# Patient Record
Sex: Female | Born: 1955 | Race: White | Hispanic: No | Smoking: Former smoker
Health system: Southern US, Community
[De-identification: ages and names within clinical notes are randomized; demographics above are authoritative.]

## PROBLEM LIST (undated history)

## (undated) DIAGNOSIS — R519 Headache, unspecified: Secondary | ICD-10-CM

## (undated) DIAGNOSIS — E079 Disorder of thyroid, unspecified: Secondary | ICD-10-CM

## (undated) DIAGNOSIS — R079 Chest pain, unspecified: Secondary | ICD-10-CM

## (undated) DIAGNOSIS — R51 Headache: Secondary | ICD-10-CM

## (undated) DIAGNOSIS — K219 Gastro-esophageal reflux disease without esophagitis: Secondary | ICD-10-CM

## (undated) DIAGNOSIS — F419 Anxiety disorder, unspecified: Secondary | ICD-10-CM

## (undated) HISTORY — PX: ABDOMINAL HYSTERECTOMY: SHX81

## (undated) HISTORY — PX: TONSILLECTOMY: SUR1361

---

## 2015-12-17 ENCOUNTER — Other Ambulatory Visit: Payer: Self-pay | Admitting: Ophthalmology

## 2015-12-17 NOTE — Progress Notes (Signed)
Left voice message at [phone] to make MD aware that both phone numbers were invalid therefore, pt was not contacted with pre-op instructions. Will leave note for nursing staff in the AM.

## 2015-12-17 NOTE — Progress Notes (Signed)
Unable to contact pt because both contact numbers are invalid; [phone] rings and then stops and [phone] is not in service.

## 2015-12-18 ENCOUNTER — Ambulatory Visit (HOSPITAL_COMMUNITY): Payer: Self-pay | Admitting: Anesthesiology

## 2015-12-18 ENCOUNTER — Encounter (HOSPITAL_COMMUNITY): Payer: Self-pay | Admitting: Urology

## 2015-12-18 ENCOUNTER — Encounter (HOSPITAL_COMMUNITY)
Admission: RE | Disposition: A | Discharge status: Home / Self Care | Payer: Self-pay | Source: Ambulatory Visit | Attending: Ophthalmology

## 2015-12-18 ENCOUNTER — Ambulatory Visit (HOSPITAL_COMMUNITY)
Admission: RE | Admit: 2015-12-18 | Discharge: 2015-12-18 | Disposition: A | Discharge status: Home / Self Care | Payer: Self-pay | Source: Ambulatory Visit | Attending: Ophthalmology | Admitting: Ophthalmology

## 2015-12-18 DIAGNOSIS — K219 Gastro-esophageal reflux disease without esophagitis: Secondary | ICD-10-CM | POA: Insufficient documentation

## 2015-12-18 DIAGNOSIS — H33011 Retinal detachment with single break, right eye: Secondary | ICD-10-CM

## 2015-12-18 DIAGNOSIS — Z87891 Personal history of nicotine dependence: Secondary | ICD-10-CM | POA: Insufficient documentation

## 2015-12-18 DIAGNOSIS — H3321 Serous retinal detachment, right eye: Secondary | ICD-10-CM | POA: Insufficient documentation

## 2015-12-18 HISTORY — DX: Disorder of thyroid, unspecified: E07.9

## 2015-12-18 HISTORY — PX: SCLERAL BUCKLE: SHX5340

## 2015-12-18 HISTORY — DX: Chest pain, unspecified: R07.9

## 2015-12-18 HISTORY — DX: Anxiety disorder, unspecified: F41.9

## 2015-12-18 HISTORY — DX: Headache: R51

## 2015-12-18 HISTORY — DX: Headache, unspecified: R51.9

## 2015-12-18 HISTORY — DX: Gastro-esophageal reflux disease without esophagitis: K21.9

## 2015-12-18 LAB — BASIC METABOLIC PANEL
Anion gap: 8 (ref 5–15)
BUN: 8 mg/dL (ref 6–20)
CHLORIDE: 108 mmol/L (ref 101–111)
CO2: 26 mmol/L (ref 22–32)
CREATININE: 0.7 mg/dL (ref 0.44–1.00)
Calcium: 9.3 mg/dL (ref 8.9–10.3)
GFR calc Af Amer: >60 mL/min (ref >60)
GFR calc non Af Amer: >60 mL/min (ref >60)
GLUCOSE: 89 mg/dL (ref 65–99)
POTASSIUM: 3.6 mmol/L (ref 3.5–5.1)
Sodium: 142 mmol/L (ref 135–145)

## 2015-12-18 LAB — CBC
HEMATOCRIT: 38.8 % (ref 36.0–46.0)
HEMOGLOBIN: 12.5 g/dL (ref 12.0–15.0)
MCH: 29.7 pg (ref 26.0–34.0)
MCHC: 32.2 g/dL (ref 30.0–36.0)
MCV: 92.2 fL (ref 78.0–100.0)
Platelets: 231 10*3/uL (ref 150–400)
RBC: 4.21 MIL/uL (ref 3.87–5.11)
RDW: 12.9 % (ref 11.5–15.5)
WBC: 4.7 10*3/uL (ref 4.0–10.5)

## 2015-12-18 SURGERY — SCLERAL BUCKLE
Anesthesia: General | Site: Eye | Laterality: Right

## 2015-12-18 MED ORDER — STERILE WATER FOR INJECTION IJ SOLN
INTRAMUSCULAR | Status: AC
  Filled 2015-12-18: qty 10

## 2015-12-18 MED ORDER — FENTANYL CITRATE (PF) 100 MCG/2ML IJ SOLN
INTRAMUSCULAR | Status: AC
  Filled 2015-12-18: qty 2

## 2015-12-18 MED ORDER — HYPROMELLOSE (GONIOSCOPIC) 2.5 % OP SOLN
OPHTHALMIC | Status: DC | PRN
  Administered 2015-12-18: 2 [drp] via OPHTHALMIC

## 2015-12-18 MED ORDER — BSS IO SOLN
INTRAOCULAR | Status: DC | PRN
  Administered 2015-12-18: 15 mL via INTRAOCULAR

## 2015-12-18 MED ORDER — TOBRAMYCIN-DEXAMETHASONE 0.3-0.1 % OP OINT
TOPICAL_OINTMENT | OPHTHALMIC | Status: AC
  Filled 2015-12-18: qty 3.5

## 2015-12-18 MED ORDER — SUGAMMADEX SODIUM 200 MG/2ML IV SOLN
INTRAVENOUS | Status: DC | PRN
  Administered 2015-12-18: 200 mg via INTRAVENOUS

## 2015-12-18 MED ORDER — CEFAZOLIN (ANCEF) 1 G IV SOLR
1.0000 g | INTRAVENOUS | Status: DC
  Filled 2015-12-18: qty 1

## 2015-12-18 MED ORDER — DEXAMETHASONE SODIUM PHOSPHATE 10 MG/ML IJ SOLN
INTRAMUSCULAR | Status: DC | PRN
  Administered 2015-12-18: 10 mg

## 2015-12-18 MED ORDER — HYALURONIDASE HUMAN 150 UNIT/ML IJ SOLN
INTRAMUSCULAR | Status: AC
  Filled 2015-12-18: qty 1

## 2015-12-18 MED ORDER — DEXAMETHASONE SODIUM PHOSPHATE 4 MG/ML IJ SOLN
INTRAMUSCULAR | Status: DC | PRN
  Administered 2015-12-18: 10 mg via INTRAVENOUS

## 2015-12-18 MED ORDER — LACTATED RINGERS IV SOLN
INTRAVENOUS | Status: DC
  Administered 2015-12-18: 12:00:00 via INTRAVENOUS

## 2015-12-18 MED ORDER — MIDAZOLAM HCL 2 MG/2ML IJ SOLN
INTRAMUSCULAR | Status: AC
  Filled 2015-12-18: qty 2

## 2015-12-18 MED ORDER — ATROPINE SULFATE 1 % OP SOLN
1.0000 [drp] | OPHTHALMIC | Status: AC
  Administered 2015-12-18 (×3): 1 [drp] via OPHTHALMIC

## 2015-12-18 MED ORDER — LACTATED RINGERS IV SOLN
INTRAVENOUS | Status: DC | PRN
  Administered 2015-12-18 (×2): via INTRAVENOUS

## 2015-12-18 MED ORDER — MIDAZOLAM HCL 5 MG/5ML IJ SOLN
INTRAMUSCULAR | Status: DC | PRN
  Administered 2015-12-18: 1 mg via INTRAVENOUS

## 2015-12-18 MED ORDER — ACETAMINOPHEN 325 MG PO TABS
ORAL_TABLET | ORAL | Status: AC
  Filled 2015-12-18: qty 2

## 2015-12-18 MED ORDER — STERILE WATER FOR INJECTION IJ SOLN
INTRAMUSCULAR | Status: DC | PRN
  Administered 2015-12-18: 10 mL

## 2015-12-18 MED ORDER — BUPIVACAINE HCL (PF) 0.75 % IJ SOLN
INTRAMUSCULAR | Status: DC | PRN
  Administered 2015-12-18: 2 mL

## 2015-12-18 MED ORDER — LIDOCAINE HCL (CARDIAC) 20 MG/ML IV SOLN
INTRAVENOUS | Status: DC | PRN
  Administered 2015-12-18: 60 mg via INTRAVENOUS

## 2015-12-18 MED ORDER — HYPROMELLOSE (GONIOSCOPIC) 2.5 % OP SOLN
OPHTHALMIC | Status: AC
  Filled 2015-12-18: qty 15

## 2015-12-18 MED ORDER — PROPOFOL 10 MG/ML IV BOLUS
INTRAVENOUS | Status: DC | PRN
  Administered 2015-12-18: 180 mg via INTRAVENOUS

## 2015-12-18 MED ORDER — PHENYLEPHRINE HCL 10 MG/ML IJ SOLN
INTRAMUSCULAR | Status: DC | PRN
  Administered 2015-12-18 (×2): 40 ug via INTRAVENOUS

## 2015-12-18 MED ORDER — FENTANYL CITRATE (PF) 100 MCG/2ML IJ SOLN: 25.0000 ug | INTRAMUSCULAR | Status: DC | PRN

## 2015-12-18 MED ORDER — ACETAMINOPHEN 325 MG PO TABS
650.0000 mg | ORAL_TABLET | Freq: Once | ORAL | Status: AC
  Administered 2015-12-18: 650 mg via ORAL

## 2015-12-18 MED ORDER — PROPOFOL 10 MG/ML IV BOLUS
INTRAVENOUS | Status: AC
  Filled 2015-12-18: qty 20

## 2015-12-18 MED ORDER — LIDOCAINE HCL 2 % IJ SOLN
INTRAMUSCULAR | Status: AC
  Filled 2015-12-18: qty 20

## 2015-12-18 MED ORDER — LIDOCAINE HCL 2 % IJ SOLN
INTRAMUSCULAR | Status: DC | PRN
  Administered 2015-12-18: 2 mL

## 2015-12-18 MED ORDER — BSS IO SOLN
INTRAOCULAR | Status: AC
  Filled 2015-12-18: qty 15

## 2015-12-18 MED ORDER — BUPIVACAINE HCL (PF) 0.75 % IJ SOLN
INTRAMUSCULAR | Status: AC
  Filled 2015-12-18: qty 10

## 2015-12-18 MED ORDER — PHENYLEPHRINE HCL 2.5 % OP SOLN
1.0000 [drp] | OPHTHALMIC | Status: AC
  Administered 2015-12-18 (×3): 1 [drp] via OPHTHALMIC

## 2015-12-18 MED ORDER — EPINEPHRINE HCL 1 MG/ML IJ SOLN
INTRAMUSCULAR | Status: AC
  Filled 2015-12-18: qty 1

## 2015-12-18 MED ORDER — ROCURONIUM BROMIDE 100 MG/10ML IV SOLN
INTRAVENOUS | Status: DC | PRN
  Administered 2015-12-18: 40 mg via INTRAVENOUS
  Administered 2015-12-18: 20 mg via INTRAVENOUS

## 2015-12-18 MED ORDER — ONDANSETRON HCL 4 MG/2ML IJ SOLN
INTRAMUSCULAR | Status: DC | PRN
  Administered 2015-12-18: 4 mg via INTRAVENOUS

## 2015-12-18 MED ORDER — CEFAZOLIN SODIUM 1 G IJ SOLR
INTRAMUSCULAR | Status: DC | PRN
  Administered 2015-12-18: 1 g

## 2015-12-18 MED ORDER — ATROPINE SULFATE 1 % OP SOLN
1.0000 [drp] | OPHTHALMIC | Status: DC
  Filled 2015-12-18: qty 5

## 2015-12-18 MED ORDER — DEXAMETHASONE SODIUM PHOSPHATE 10 MG/ML IJ SOLN
INTRAMUSCULAR | Status: AC
  Filled 2015-12-18: qty 1

## 2015-12-18 MED ORDER — PHENYLEPHRINE HCL 2.5 % OP SOLN
1.0000 [drp] | OPHTHALMIC | Status: DC
  Filled 2015-12-18: qty 2

## 2015-12-18 MED ORDER — TETRACAINE HCL 0.5 % OP SOLN
OPHTHALMIC | Status: AC
  Filled 2015-12-18: qty 2

## 2015-12-18 MED ORDER — HYALURONIDASE HUMAN 150 UNIT/ML IJ SOLN
INTRAMUSCULAR | Status: DC | PRN
  Administered 2015-12-18: 75 [IU]

## 2015-12-18 MED ORDER — LIDOCAINE HCL (PF) 1 % IJ SOLN
INTRAMUSCULAR | Status: AC
  Filled 2015-12-18: qty 30

## 2015-12-18 MED ORDER — BSS PLUS IO SOLN
INTRAOCULAR | Status: AC
  Filled 2015-12-18: qty 500

## 2015-12-18 MED ORDER — FENTANYL CITRATE (PF) 100 MCG/2ML IJ SOLN
INTRAMUSCULAR | Status: DC | PRN
  Administered 2015-12-18 (×2): 50 ug via INTRAVENOUS

## 2015-12-18 MED ORDER — TOBRAMYCIN-DEXAMETHASONE 0.3-0.1 % OP OINT
TOPICAL_OINTMENT | OPHTHALMIC | Status: DC | PRN
  Administered 2015-12-18: 1 via OPHTHALMIC

## 2015-12-18 SURGICAL SUPPLY — 42 items
APL SRG 3 HI ABS STRL LF PLS (MISCELLANEOUS) ×1
APPLICATOR COTTON TIP 6IN STRL (MISCELLANEOUS) ×3 IMPLANT
APPLICATOR DR MATTHEWS STRL (MISCELLANEOUS) ×3 IMPLANT
BAND SCLERAL BUCKLING TYPE 42 (Ophthalmic Related) ×2 IMPLANT
CANNULA DUALBORE 25G (Cannula) ×3 IMPLANT
CORDS BIPOLAR (ELECTRODE) ×2 IMPLANT
COVER SURGICAL LIGHT HANDLE (MISCELLANEOUS) ×3 IMPLANT
FILTER BLUE MILLIPORE (MISCELLANEOUS) ×2 IMPLANT
GAS OPHTHALMIC (MISCELLANEOUS) IMPLANT
GLOVE BIO SURGEON STRL SZ7.5 (GLOVE) ×5 IMPLANT
GLOVE BIOGEL PI IND STRL 7.0 (GLOVE) IMPLANT
GLOVE BIOGEL PI INDICATOR 7.0 (GLOVE) ×2
GOWN STRL REUS W/ TWL LRG LVL3 (GOWN DISPOSABLE) ×2 IMPLANT
GOWN STRL REUS W/TWL LRG LVL3 (GOWN DISPOSABLE) ×6
KIT BASIN OR (CUSTOM PROCEDURE TRAY) ×3 IMPLANT
KIT ROOM TURNOVER OR (KITS) ×3 IMPLANT
LENS BIOM SUPER VIEW SET DISP (OPHTHALMIC RELATED) ×2 IMPLANT
NDL 18GX1X1/2 (RX/OR ONLY) (NEEDLE) ×1 IMPLANT
NDL FILTER BLUNT 18X1 1/2 (NEEDLE) IMPLANT
NDL HYPO 25GX1X1/2 BEV (NEEDLE) IMPLANT
NDL HYPO 30X.5 LL (NEEDLE) ×2 IMPLANT
NEEDLE 18GX1X1/2 (RX/OR ONLY) (NEEDLE) ×6 IMPLANT
NEEDLE FILTER BLUNT 18X 1/2SAF (NEEDLE) ×2
NEEDLE FILTER BLUNT 18X1 1/2 (NEEDLE) ×1 IMPLANT
NEEDLE HYPO 25GX1X1/2 BEV (NEEDLE) ×3 IMPLANT
NEEDLE HYPO 30X.5 LL (NEEDLE) ×6 IMPLANT
NS IRRIG 1000ML POUR BTL (IV SOLUTION) ×3 IMPLANT
PACK VITRECTOMY CUSTOM (CUSTOM PROCEDURE TRAY) ×3 IMPLANT
PAD ARMBOARD 7.5X6 YLW CONV (MISCELLANEOUS) ×6 IMPLANT
ROLLS DENTAL (MISCELLANEOUS) ×4 IMPLANT
SHEET MEDIUM DRAPE 40X70 STRL (DRAPES) ×3 IMPLANT
SLEEVE SURGEON STRL (DRAPES) ×2 IMPLANT
STOCKINETTE IMPERVIOUS 9X36 MD (GAUZE/BANDAGES/DRESSINGS) ×4 IMPLANT
SUT ETHILON 5.0 S-24 (SUTURE) ×5 IMPLANT
SUT SILK 2 0 (SUTURE) ×3
SUT SILK 2-0 18XBRD TIE 12 (SUTURE) ×1 IMPLANT
SUT VICRYL 7 0 TG140 8 (SUTURE) ×2 IMPLANT
SYR 3ML LL SCALE MARK (SYRINGE) ×2 IMPLANT
SYRINGE 10CC LL (SYRINGE) ×2 IMPLANT
TOWEL OR 17X24 6PK STRL BLUE (TOWEL DISPOSABLE) ×4 IMPLANT
WATER STERILE IRR 1000ML POUR (IV SOLUTION) ×3 IMPLANT
WIPE INSTRUMENT VISIWIPE 73X73 (MISCELLANEOUS) ×2 IMPLANT

## 2015-12-18 NOTE — Anesthesia Postprocedure Evaluation (Signed)
Anesthesia Post Note  Patient: [name]  Procedure(s) Performed: Procedure(s) (LRB): SCLERAL BUCKLE with CRYO and GAS BUBBLE to RIGHT EYE (Right)  Patient location during evaluation: PACU Anesthesia Type: General Level of consciousness: awake Pain management: pain level controlled Vital Signs Assessment: post-procedure vital signs reviewed and stable Respiratory status: spontaneous breathing Cardiovascular status: stable Anesthetic complications: no    Last Vitals:  Vitals:   12/18/15 1525 12/18/15 1535  BP: 113/69 119/69  Pulse: 60   Resp: 15 12  Temp: 36.6 C 36.3 C    Last Pain:  Vitals:   12/18/15 1535  TempSrc:   PainSc: 0-No pain                 EDWARDS,Turner Kunzman

## 2015-12-18 NOTE — Brief Op Note (Signed)
12/18/2015  2:39 PM  PATIENT:  [name]  60 y.o. female  PRE-OPERATIVE DIAGNOSIS:  Retinal detachment  POST-OPERATIVE DIAGNOSIS:  Retinal detachment  PROCEDURE:  Procedure(s): SCLERAL BUCKLE with CRYO and GAS BUBBLE to RIGHT EYE (Right)  SURGEON:  Surgeon(s) and Role:    * Stephannie LiJason Quina Wilbourne, MD - Primary  PHYSICIAN ASSISTANT:   ASSISTANTS: none   ANESTHESIA:   general  EBL:  Total I/O In: 1000 [I.V.:1000] Out: -   BLOOD ADMINISTERED:none  DRAINS: none   LOCAL MEDICATIONS USED:  BUPIVICAINE   SPECIMEN:  No Specimen  DISPOSITION OF SPECIMEN:  N/A  COUNTS:  YES  TOURNIQUET:  * No tourniquets in log *  DICTATION: .Other Dictation: Dictation Number [id number]  PLAN OF CARE: Discharge to home after PACU  PATIENT DISPOSITION:  PACU - hemodynamically stable.   Delay start of Pharmacological VTE agent (>24hrs) due to surgical blood loss or risk of bleeding: not applicable

## 2015-12-18 NOTE — H&P (Signed)
[name] is an 60 y.o. female.   Chief Complaint: vision loss OD  HPI: RD OD    Past Medical History:  Diagnosis Date  . Anxiety   . Chest pain    5-6 years ago, EKG done and everything okay per pt report  . GERD (gastroesophageal reflux disease)   . Headache    migraines  . Thyroid disorder    pt unsure what was wrong, but it was a long time ago    Past Surgical History:  Procedure Laterality Date  . ABDOMINAL HYSTERECTOMY    . TONSILLECTOMY      History reviewed. No pertinent family history. Social History:  reports that she has quit smoking. Her smoking use included Cigarettes. She has never used smokeless tobacco. She reports that she does not drink alcohol or use drugs.  Allergies:  Allergies  Allergen Reactions  . Vicodin [Hydrocodone-Acetaminophen] Anaphylaxis and Swelling  . Bc Fast Pain [Aspirin-Salicylamide-Caffeine]     "messed up stomach and throat"    Medications Prior to Admission  Medication Sig Dispense Refill  . Ca Carbonate-Mag Hydroxide (ROLAIDS PO) Take 1 tablet by mouth as needed (for acid reflux).    . ciprofloxacin (CILOXAN) 0.3 % ophthalmic solution Place 1 drop into the right eye 4 (four) times daily. Administer 1 drop, every 2 hours, while awake, for 2 days. Then 1 drop, every 4 hours, while awake, for the next 5 days.    Marland Kitchen ibuprofen (ADVIL,MOTRIN) 200 MG tablet Take 200 mg by mouth every 6 (six) hours as needed.    . Multiple Vitamins-Minerals (MULTIVITAMIN PO) Take 1 tablet by mouth daily.      Results for orders placed or performed during the hospital encounter of 12/18/15 (from the past 48 hour(s))  CBC     Status: None   Collection Time: 12/18/15 11:21 AM  Result Value Ref Range   WBC 4.7 4.0 - 10.5 K/uL   RBC 4.21 3.87 - 5.11 MIL/uL   Hemoglobin 12.5 12.0 - 15.0 g/dL   HCT 38.8 36.0 - 46.0 %   MCV 92.2 78.0 - 100.0 fL   MCH 29.7 26.0 - 34.0 pg   MCHC 32.2 30.0 - 36.0 g/dL   RDW 12.9 11.5 - 15.5 %   Platelets 231 150 - 400 K/uL   Basic metabolic panel     Status: None   Collection Time: 12/18/15 11:21 AM  Result Value Ref Range   Sodium 142 135 - 145 mmol/L   Potassium 3.6 3.5 - 5.1 mmol/L   Chloride 108 101 - 111 mmol/L   CO2 26 22 - 32 mmol/L   Glucose, Bld 89 65 - 99 mg/dL   BUN 8 6 - 20 mg/dL   Creatinine, Ser 0.70 0.44 - 1.00 mg/dL   Calcium 9.3 8.9 - 10.3 mg/dL   GFR calc non Af Amer >60 >60 mL/min   GFR calc Af Amer >60 >60 mL/min    Comment: (NOTE) The eGFR has been calculated using the CKD EPI equation. This calculation has not been validated in all clinical situations. eGFR's persistently <60 mL/min signify possible Chronic Kidney Disease.    Anion gap 8 5 - 15   No results found.  Review of Systems  All other systems reviewed and are negative.   Blood pressure (!) 152/75, pulse (!) 59, temperature 98.5 F (36.9 C), temperature source Oral, resp. rate 18, height '5\' 6"'$  (1.676 m), weight 79.4 kg (175 lb), SpO2 100 %. Physical Exam  Eyes:  Assessment/Plan 1. RD OD: SBP, drain, gas bubble OD  Timithy Arons B, MD 12/18/2015, 12:37 PM

## 2015-12-18 NOTE — OR Nursing (Signed)
12/18/2015 C3F8 Gas bubble injected into right eye by Dr. Stephannie LiJason Sanders. ID bracelet placed on patient's right wrist and information card sent with the patient to PACU. Mar DaringLinda Salima Rumer, RN

## 2015-12-18 NOTE — Anesthesia Procedure Notes (Signed)
Procedure Name: Intubation Date/Time: 12/18/2015 12:46 PM Performed by: Carmela RimaMARTINELLI, Shevette Bess F Pre-anesthesia Checklist: Patient identified, Emergency Drugs available, Suction available and Patient being monitored Patient Re-evaluated:Patient Re-evaluated prior to inductionOxygen Delivery Method: Circle System Utilized Preoxygenation: Pre-oxygenation with 100% oxygen Intubation Type: IV induction Ventilation: Mask ventilation without difficulty Laryngoscope Size: Miller and 2 Grade View: Grade I Tube type: Oral Tube size: 7.0 mm Number of attempts: 1 Airway Equipment and Method: Stylet and Oral airway Placement Confirmation: ETT inserted through vocal cords under direct vision,  positive ETCO2 and breath sounds checked- equal and bilateral Secured at: 21 cm Tube secured with: Tape Dental Injury: Teeth and Oropharynx as per pre-operative assessment  Comments: Intubation by Shelton SilvasJ. Yun, SRNA

## 2015-12-18 NOTE — Transfer of Care (Signed)
Immediate Anesthesia Transfer of Care Note  Patient: Tara PlattNancy V Privitera  Procedure(s) Performed: Procedure(s): SCLERAL BUCKLE with CRYO and GAS BUBBLE to RIGHT EYE (Right)  Patient Location: PACU  Anesthesia Type:General  Level of Consciousness: awake and patient cooperative  Airway & Oxygen Therapy: Patient Spontanous Breathing and Patient connected to nasal cannula oxygen  Post-op Assessment: Report given to RN, Post -op Vital signs reviewed and stable and Patient moving all extremities  Post vital signs: Reviewed and stable  Last Vitals:  Vitals:   12/18/15 1034 12/18/15 1440  BP: (!) 152/75 137/65  Pulse: (!) 59 71  Resp: 18 13  Temp: 36.9 C 36.4 C    Last Pain:  Vitals:   12/18/15 1034  TempSrc: Oral         Complications: No apparent anesthesia complications

## 2015-12-18 NOTE — Anesthesia Preprocedure Evaluation (Addendum)
Anesthesia Evaluation  Patient identified by MRN, date of birth, ID band Patient awake    Reviewed: Allergy & Precautions, H&P , NPO status , Patient's Chart, lab work & pertinent test results  Airway Mallampati: II  TM Distance: >3 FB Neck ROM: full    Dental  (+) Dental Advidsory Given, Poor Dentition   Pulmonary former smoker,    breath sounds clear to auscultation       Cardiovascular  Rhythm:Regular Rate:Normal     Neuro/Psych  Headaches,    GI/Hepatic Neg liver ROS, GERD  Controlled,  Endo/Other  negative endocrine ROS  Renal/GU negative Renal ROS     Musculoskeletal   Abdominal   Peds  Hematology   Anesthesia Other Findings   Reproductive/Obstetrics                            Anesthesia Physical Anesthesia Plan  ASA: III  Anesthesia Plan: General   Post-op Pain Management:    Induction: Intravenous  Airway Management Planned: Oral ETT  Additional Equipment:   Intra-op Plan:   Post-operative Plan: Extubation in OR  Informed Consent:   Dental Advisory Given and Dental advisory given  Plan Discussed with: Anesthesiologist, CRNA and Surgeon  Anesthesia Plan Comments:        Anesthesia Quick Evaluation

## 2015-12-19 ENCOUNTER — Encounter (HOSPITAL_COMMUNITY): Payer: Self-pay | Admitting: Ophthalmology

## 2015-12-19 NOTE — Op Note (Signed)
[name], [name] NO.:  [id number]  MEDICAL RECORD NO.:  [id number]  LOCATION:  [facility]                         FACILITY:  [facility]  PHYSICIAN:  Tara Saxon, MD    DATE OF BIRTH:  [birth year]  DATE OF PROCEDURE:  12/18/2015 DATE OF DISCHARGE:  12/18/2015                              OPERATIVE REPORT   SURGEON:  Tara Saxon, MD.  ANESTHESIA:  General with endotracheal intubation coupled with local retrobulbar block consisting of 1:1 mixture of 0.75% bupivacaine and 1% lidocaine and 150 units of Halonix.  PREOPERATIVE DIAGNOSIS:  Retinal detachment, right eye.  POSTOPERATIVE DIAGNOSIS:  Retinal detachment, right eye.  OPERATIVE PROCEDURE:  Scleral buckle procedure with drainage of subretinal fluid and injection of intravitreal gas, right eye.  COMPLICATIONS:  None.  FINDINGS:  There was a superior nasal retinal break at 2:30 position. There was a macula off retinal detachment in the right eye.  DESCRIPTION OF PROCEDURE:  [name] was identified in the preoperative holding area.  She was then taken to the operating room, where she was sedated and placed under general anesthesia.  At that point in time, the right eye was prepped and draped in usual sterile fashion for ocular surgery including trimming of lashes.  Next, the eye was anesthetized using a retrobulbar block consisting of 1:1 mixture of 0.75% bupivacaine and 1% lidocaine with 150 units of Halonix.  The peel was removed.  A Lieberman speculum was used to separate the right upper and lower eyelids.  A 360-degree conjunctival peritomy was performed with 0.3 forceps and Westcott scissors to prepare the eye for standard scleral buckle procedure.  Stevens tenotomy scissors were then used to dissect Tenon capsule off the sclera in all 4 quadrants.  The 4 recti muscles were isolated and looped using 2-0 silk sutures.  The eye was then inspected with scleral depression.  A single retinal break  was identified in the 2:30 location.  Location of this break was marked on the external sclera using a surgical pen and an O'Connor marker.  Once this was in place, cryotherapy was placed around the retinal break. Next, attention was directed towards placement of the nylon sutures.  A 5-0 nylon sutures were placed in a horizontal mattress fashion in each of the oblique quadrants to support a #42 scleral buckle.  Once the sutures were placed, the eye was encircled with a #42 scleral buckle with the ends of the buckle joined with a #70 sleeve in the inferior nasal quadrant.  The buckle was left loose at this time.  The eye was then inspected with indirect ophthalmoscopy.  A nice pocket of subretinal fluid was identified in the inferior nasal quadrant.  This fluid was tapped externally using a 25-gauge needle which was guided under direct visualization into the subretinal space through the sclera. The needle was observed entering the subretinal space.  Once this took place, the subretinal fluid drained completely.  The needle was withdrawn without retinal incarceration.  Once this was completed, a 30- gauge needle was used to inject 0.3 mL of 100% C3F8 gas into the vitreous cavity.  The needle was removed.  A gas bubble was  seen to go into the correct position.  Next, the buckle height was adjusted. Excess buckle material was trimmed and discarded.  The 5-0 nylon sutures were tied with the knots rotated posteriorly and trimmed.  Once this was completed, the retinal detachment was relieved and the break was resting nicely on the buckle.  Therefore, the 2-0 silk sutures were removed from the muscle insertions.  A 7-0 nylon sutures were used in tie to reapproximate Tenon capsule and conjunctiva to the limbus without difficulty.  Once this was completed, the eye was treated with subconjunctival injections of 15 mg Ancef and 1 mg of dexamethasone. The eye was treated topically with 1 drop of 1%  atropine and TobraDex ointment.  The speculum was removed.  Eyelids were cleaned and closed and then patched and shielded.  The patient was taken to recovery in excellent condition having tolerated the procedure very well.     Tara SaxonJason B Ruhi Kopke, MD     JBS/MEDQ  D:  12/18/2015  T:  12/19/2015  Job:  [id number]

## 2016-01-02 ENCOUNTER — Other Ambulatory Visit: Payer: Self-pay | Admitting: Ophthalmology

## 2016-01-02 NOTE — Progress Notes (Signed)
Attempted pre-op call x 2 and call went straight to voicemail. Left pre-op instructions according to Pre-op Call Checklist on patient's voicemail. Instructed pt to arrive at 11:20 AM, NPO after midnight Sunday, 01/04/16

## 2016-01-05 ENCOUNTER — Ambulatory Visit (HOSPITAL_COMMUNITY): Payer: Self-pay | Admitting: Certified Registered"

## 2016-01-05 ENCOUNTER — Encounter (HOSPITAL_COMMUNITY)
Admission: AD | Disposition: A | Discharge status: Home / Self Care | Payer: Self-pay | Source: Ambulatory Visit | Attending: Ophthalmology

## 2016-01-05 ENCOUNTER — Encounter (HOSPITAL_COMMUNITY): Payer: Self-pay | Admitting: *Deleted

## 2016-01-05 ENCOUNTER — Ambulatory Visit (HOSPITAL_COMMUNITY)
Admission: AD | Admit: 2016-01-05 | Discharge: 2016-01-05 | Disposition: A | Discharge status: Home / Self Care | Payer: Self-pay | Source: Ambulatory Visit | Attending: Ophthalmology | Admitting: Ophthalmology

## 2016-01-05 DIAGNOSIS — K219 Gastro-esophageal reflux disease without esophagitis: Secondary | ICD-10-CM | POA: Insufficient documentation

## 2016-01-05 DIAGNOSIS — H4311 Vitreous hemorrhage, right eye: Secondary | ICD-10-CM | POA: Insufficient documentation

## 2016-01-05 DIAGNOSIS — H33011 Retinal detachment with single break, right eye: Secondary | ICD-10-CM

## 2016-01-05 DIAGNOSIS — H3321 Serous retinal detachment, right eye: Secondary | ICD-10-CM | POA: Insufficient documentation

## 2016-01-05 HISTORY — PX: PARS PLANA VITRECTOMY: SHX2166

## 2016-01-05 HISTORY — PX: GAS/FLUID EXCHANGE: SHX5334

## 2016-01-05 HISTORY — PX: LASER PHOTO ABLATION: SHX5942

## 2016-01-05 LAB — CBC
HEMATOCRIT: 43.4 % (ref 36.0–46.0)
Hemoglobin: 14.2 g/dL (ref 12.0–15.0)
MCH: 30 pg (ref 26.0–34.0)
MCHC: 32.7 g/dL (ref 30.0–36.0)
MCV: 91.6 fL (ref 78.0–100.0)
PLATELETS: 274 10*3/uL (ref 150–400)
RBC: 4.74 MIL/uL (ref 3.87–5.11)
RDW: 12.6 % (ref 11.5–15.5)
WBC: 5.9 10*3/uL (ref 4.0–10.5)

## 2016-01-05 SURGERY — PARS PLANA VITRECTOMY WITH 25 GAUGE
Anesthesia: General | Site: Eye | Laterality: Right

## 2016-01-05 MED ORDER — ROCURONIUM BROMIDE 100 MG/10ML IV SOLN
INTRAVENOUS | Status: DC | PRN
  Administered 2016-01-05: 50 mg via INTRAVENOUS

## 2016-01-05 MED ORDER — PHENYLEPHRINE HCL 2.5 % OP SOLN: 1.0000 [drp] | OPHTHALMIC | Status: DC

## 2016-01-05 MED ORDER — TRIAMCINOLONE ACETONIDE 40 MG/ML IJ SUSP
INTRAMUSCULAR | Status: AC
  Filled 2016-01-05: qty 5

## 2016-01-05 MED ORDER — EPINEPHRINE HCL 1 MG/ML IJ SOLN
INTRAOCULAR | Status: DC | PRN
  Administered 2016-01-05: .3 mL

## 2016-01-05 MED ORDER — EPINEPHRINE HCL 1 MG/ML IJ SOLN
INTRAMUSCULAR | Status: AC
  Filled 2016-01-05: qty 1

## 2016-01-05 MED ORDER — TETRACAINE HCL 0.5 % OP SOLN
OPHTHALMIC | Status: AC
  Filled 2016-01-05: qty 2

## 2016-01-05 MED ORDER — SODIUM CHLORIDE 0.9 % IV SOLN
INTRAVENOUS | Status: DC
  Administered 2016-01-05 (×2): via INTRAVENOUS

## 2016-01-05 MED ORDER — TOBRAMYCIN-DEXAMETHASONE 0.3-0.1 % OP OINT
TOPICAL_OINTMENT | OPHTHALMIC | Status: AC
  Filled 2016-01-05: qty 3.5

## 2016-01-05 MED ORDER — EPHEDRINE SULFATE 50 MG/ML IJ SOLN
INTRAMUSCULAR | Status: DC | PRN
  Administered 2016-01-05: 5 mg via INTRAVENOUS
  Administered 2016-01-05: 10 mg via INTRAVENOUS

## 2016-01-05 MED ORDER — BUPIVACAINE HCL (PF) 0.75 % IJ SOLN
INTRAMUSCULAR | Status: AC
  Filled 2016-01-05: qty 10

## 2016-01-05 MED ORDER — PROPOFOL 10 MG/ML IV BOLUS
INTRAVENOUS | Status: DC | PRN
  Administered 2016-01-05: 120 mg via INTRAVENOUS

## 2016-01-05 MED ORDER — ATROPINE SULFATE 1 % OP SOLN
OPHTHALMIC | Status: AC
  Administered 2016-01-05: 1 [drp] via OPHTHALMIC
  Filled 2016-01-05: qty 5

## 2016-01-05 MED ORDER — BUPIVACAINE-EPINEPHRINE (PF) 0.25% -1:200000 IJ SOLN
INTRAMUSCULAR | Status: AC
  Filled 2016-01-05: qty 30

## 2016-01-05 MED ORDER — SODIUM HYALURONATE 10 MG/ML IO SOLN
INTRAOCULAR | Status: AC
  Filled 2016-01-05: qty 0.85

## 2016-01-05 MED ORDER — CEFAZOLIN SODIUM 1 G IJ SOLR
INTRAMUSCULAR | Status: DC | PRN
  Administered 2016-01-05: 1 g via INTRAMUSCULAR

## 2016-01-05 MED ORDER — LIDOCAINE HCL 2 % IJ SOLN
INTRAMUSCULAR | Status: DC | PRN
  Administered 2016-01-05: 5 mL via RETROBULBAR

## 2016-01-05 MED ORDER — STERILE WATER FOR INJECTION IJ SOLN
INTRAMUSCULAR | Status: AC
  Filled 2016-01-05: qty 20

## 2016-01-05 MED ORDER — SUGAMMADEX SODIUM 200 MG/2ML IV SOLN
INTRAVENOUS | Status: AC
  Filled 2016-01-05: qty 2

## 2016-01-05 MED ORDER — PHENYLEPHRINE 40 MCG/ML (10ML) SYRINGE FOR IV PUSH (FOR BLOOD PRESSURE SUPPORT)
PREFILLED_SYRINGE | INTRAVENOUS | Status: AC
  Filled 2016-01-05: qty 10

## 2016-01-05 MED ORDER — BSS PLUS IO SOLN
INTRAOCULAR | Status: DC | PRN
  Administered 2016-01-05: 1 via INTRAOCULAR

## 2016-01-05 MED ORDER — PROPOFOL 10 MG/ML IV BOLUS
INTRAVENOUS | Status: AC
  Filled 2016-01-05: qty 20

## 2016-01-05 MED ORDER — SODIUM CHLORIDE 0.9 % IJ SOLN
INTRAMUSCULAR | Status: AC
  Filled 2016-01-05: qty 10

## 2016-01-05 MED ORDER — ATROPINE SULFATE 1 % OP SOLN
OPHTHALMIC | Status: AC
  Filled 2016-01-05: qty 5

## 2016-01-05 MED ORDER — HYPROMELLOSE (GONIOSCOPIC) 2.5 % OP SOLN
OPHTHALMIC | Status: DC | PRN
  Administered 2016-01-05: 2 [drp] via OPHTHALMIC

## 2016-01-05 MED ORDER — ATROPINE SULFATE 1 % OP SOLN
1.0000 [drp] | OPHTHALMIC | Status: AC
  Administered 2016-01-05 (×3): 1 [drp] via OPHTHALMIC

## 2016-01-05 MED ORDER — ACETAZOLAMIDE SODIUM 500 MG IJ SOLR
INTRAMUSCULAR | Status: AC
  Filled 2016-01-05: qty 500

## 2016-01-05 MED ORDER — ROCURONIUM BROMIDE 10 MG/ML (PF) SYRINGE
PREFILLED_SYRINGE | INTRAVENOUS | Status: AC
  Filled 2016-01-05: qty 10

## 2016-01-05 MED ORDER — MIDAZOLAM HCL 2 MG/2ML IJ SOLN
INTRAMUSCULAR | Status: DC | PRN
  Administered 2016-01-05: 2 mg via INTRAVENOUS

## 2016-01-05 MED ORDER — ATROPINE SULFATE 1 % OP SOLN
1.0000 [drp] | OPHTHALMIC | Status: DC
Start: 2016-01-05 — End: 2016-01-05

## 2016-01-05 MED ORDER — MIDAZOLAM HCL 2 MG/2ML IJ SOLN
INTRAMUSCULAR | Status: AC
  Filled 2016-01-05: qty 2

## 2016-01-05 MED ORDER — FENTANYL CITRATE (PF) 100 MCG/2ML IJ SOLN
INTRAMUSCULAR | Status: AC
  Filled 2016-01-05: qty 2

## 2016-01-05 MED ORDER — PROMETHAZINE HCL 25 MG/ML IJ SOLN: 6.2500 mg | INTRAMUSCULAR | Status: DC | PRN

## 2016-01-05 MED ORDER — SUGAMMADEX SODIUM 200 MG/2ML IV SOLN
INTRAVENOUS | Status: DC | PRN
  Administered 2016-01-05: 160 mg via INTRAVENOUS

## 2016-01-05 MED ORDER — DEXAMETHASONE SODIUM PHOSPHATE 10 MG/ML IJ SOLN
INTRAMUSCULAR | Status: AC
  Filled 2016-01-05: qty 1

## 2016-01-05 MED ORDER — BSS PLUS IO SOLN
INTRAOCULAR | Status: AC
  Filled 2016-01-05: qty 500

## 2016-01-05 MED ORDER — CEFTAZIDIME 1 G IJ SOLR
INTRAMUSCULAR | Status: AC
  Filled 2016-01-05: qty 1

## 2016-01-05 MED ORDER — BSS IO SOLN
INTRAOCULAR | Status: AC
  Filled 2016-01-05: qty 15

## 2016-01-05 MED ORDER — PHENYLEPHRINE HCL 10 MG/ML IJ SOLN
INTRAMUSCULAR | Status: DC | PRN
  Administered 2016-01-05: 120 ug via INTRAVENOUS
  Administered 2016-01-05: 80 ug via INTRAVENOUS

## 2016-01-05 MED ORDER — PHENYLEPHRINE HCL 2.5 % OP SOLN
OPHTHALMIC | Status: AC
  Administered 2016-01-05: 1 [drp] via OPHTHALMIC
  Filled 2016-01-05: qty 2

## 2016-01-05 MED ORDER — POLYMYXIN B SULFATE 500000 UNITS IJ SOLR
INTRAMUSCULAR | Status: AC
  Filled 2016-01-05: qty 1

## 2016-01-05 MED ORDER — EPHEDRINE 5 MG/ML INJ
INTRAVENOUS | Status: AC
  Filled 2016-01-05: qty 10

## 2016-01-05 MED ORDER — DEXAMETHASONE SODIUM PHOSPHATE 10 MG/ML IJ SOLN
INTRAMUSCULAR | Status: DC | PRN
  Administered 2016-01-05: 10 mg

## 2016-01-05 MED ORDER — TOBRAMYCIN-DEXAMETHASONE 0.3-0.1 % OP OINT
TOPICAL_OINTMENT | OPHTHALMIC | Status: DC | PRN
  Administered 2016-01-05: 1 via OPHTHALMIC

## 2016-01-05 MED ORDER — FENTANYL CITRATE (PF) 100 MCG/2ML IJ SOLN
INTRAMUSCULAR | Status: DC | PRN
  Administered 2016-01-05: 50 ug via INTRAVENOUS

## 2016-01-05 MED ORDER — HYPROMELLOSE (GONIOSCOPIC) 2.5 % OP SOLN
OPHTHALMIC | Status: AC
  Filled 2016-01-05: qty 15

## 2016-01-05 MED ORDER — HYALURONIDASE HUMAN 150 UNIT/ML IJ SOLN
INTRAMUSCULAR | Status: AC
  Filled 2016-01-05: qty 1

## 2016-01-05 MED ORDER — BACITRACIN-POLYMYXIN B 500-10000 UNIT/GM OP OINT
TOPICAL_OINTMENT | OPHTHALMIC | Status: AC
  Filled 2016-01-05: qty 3.5

## 2016-01-05 MED ORDER — ONDANSETRON HCL 4 MG/2ML IJ SOLN
INTRAMUSCULAR | Status: AC
  Filled 2016-01-05: qty 2

## 2016-01-05 MED ORDER — LIDOCAINE HCL (CARDIAC) 20 MG/ML IV SOLN
INTRAVENOUS | Status: DC | PRN
  Administered 2016-01-05: 100 mg via INTRAVENOUS

## 2016-01-05 MED ORDER — LIDOCAINE 2% (20 MG/ML) 5 ML SYRINGE
INTRAMUSCULAR | Status: AC
  Filled 2016-01-05: qty 5

## 2016-01-05 MED ORDER — LIDOCAINE HCL 2 % IJ SOLN
INTRAMUSCULAR | Status: AC
  Filled 2016-01-05: qty 20

## 2016-01-05 MED ORDER — ONDANSETRON HCL 4 MG/2ML IJ SOLN
INTRAMUSCULAR | Status: DC | PRN
  Administered 2016-01-05: 4 mg via INTRAVENOUS

## 2016-01-05 MED ORDER — FENTANYL CITRATE (PF) 100 MCG/2ML IJ SOLN: 25.0000 ug | INTRAMUSCULAR | Status: DC | PRN

## 2016-01-05 MED ORDER — PHENYLEPHRINE HCL 2.5 % OP SOLN
1.0000 [drp] | OPHTHALMIC | Status: AC
  Administered 2016-01-05 (×3): 1 [drp] via OPHTHALMIC

## 2016-01-05 SURGICAL SUPPLY — 66 items
BLADE MVR KNIFE 20G (BLADE) IMPLANT
CANNULA ANT CHAM MAIN (OPHTHALMIC RELATED) IMPLANT
CANNULA ANTERIOR CHAMBER 27GA (MISCELLANEOUS) IMPLANT
CANNULA DUAL BORE 23G (CANNULA) IMPLANT
CANNULA VLV SOFT TIP 25G (OPHTHALMIC) ×1 IMPLANT
CANNULA VLV SOFT TIP 25GA (OPHTHALMIC) ×3 IMPLANT
CAUTERY EYE LOW TEMP 1300F FIN (OPHTHALMIC RELATED) IMPLANT
CLOSURE STERI-STRIP 1/2X4 (GAUZE/BANDAGES/DRESSINGS) ×1
CLSR STERI-STRIP ANTIMIC 1/2X4 (GAUZE/BANDAGES/DRESSINGS) ×2 IMPLANT
CORDS BIPOLAR (ELECTRODE) IMPLANT
COVER MAYO STAND STRL (DRAPES) IMPLANT
DRAPE INCISE 51X51 W/FILM STRL (DRAPES) IMPLANT
DRAPE PROXIMA HALF (DRAPES) ×3 IMPLANT
DRAPE RETRACTOR (MISCELLANEOUS) ×3 IMPLANT
ERASER HMR WETFIELD 23G BP (MISCELLANEOUS) IMPLANT
FILTER BLUE MILLIPORE (MISCELLANEOUS) ×4 IMPLANT
FORCEPS ECKARDT ILM 25G SERR (OPHTHALMIC RELATED) IMPLANT
FORCEPS GRIESHABER ILM 25G A (INSTRUMENTS) IMPLANT
GAS AUTO FILL CONSTEL (OPHTHALMIC) ×3
GAS AUTO FILL CONSTELLATION (OPHTHALMIC) IMPLANT
GAS OPHTHALMIC (MISCELLANEOUS) ×2 IMPLANT
GLOVE BIO SURGEON STRL SZ7.5 (GLOVE) ×3 IMPLANT
GLOVE ECLIPSE 8.0 STRL XLNG CF (GLOVE) ×2 IMPLANT
GOWN STRL REUS W/ TWL LRG LVL3 (GOWN DISPOSABLE) ×2 IMPLANT
GOWN STRL REUS W/TWL LRG LVL3 (GOWN DISPOSABLE) ×6
HANDLE PNEUMATIC FOR CONSTEL (OPHTHALMIC) IMPLANT
KIT BASIN OR (CUSTOM PROCEDURE TRAY) ×3 IMPLANT
KIT ROOM TURNOVER OR (KITS) IMPLANT
LENS BIOM SUPER VIEW SET DISP (OPHTHALMIC RELATED) ×3 IMPLANT
MICROPICK 25G (MISCELLANEOUS)
NDL 18GX1X1/2 (RX/OR ONLY) (NEEDLE) ×1 IMPLANT
NDL 25GX 5/8IN NON SAFETY (NEEDLE) ×1 IMPLANT
NDL FILTER BLUNT 18X1 1/2 (NEEDLE) IMPLANT
NDL HYPO 25GX1X1/2 BEV (NEEDLE) IMPLANT
NDL HYPO 30X.5 LL (NEEDLE) ×1 IMPLANT
NDL RETROBULBAR 25GX1.5 (NEEDLE) ×1 IMPLANT
NEEDLE 18GX1X1/2 (RX/OR ONLY) (NEEDLE) ×3 IMPLANT
NEEDLE 25GX 5/8IN NON SAFETY (NEEDLE) ×3 IMPLANT
NEEDLE FILTER BLUNT 18X 1/2SAF (NEEDLE) ×8
NEEDLE FILTER BLUNT 18X1 1/2 (NEEDLE) ×4 IMPLANT
NEEDLE HYPO 25GX1X1/2 BEV (NEEDLE) IMPLANT
NEEDLE HYPO 30X.5 LL (NEEDLE) ×3 IMPLANT
NEEDLE RETROBULBAR 25GX1.5 (NEEDLE) ×3 IMPLANT
NS IRRIG 1000ML POUR BTL (IV SOLUTION) ×3 IMPLANT
PAD ARMBOARD 7.5X6 YLW CONV (MISCELLANEOUS) ×6 IMPLANT
PAK PIK VITRECTOMY CVS 25GA (OPHTHALMIC) ×3 IMPLANT
PENCIL BIPOLAR 25GA STR DISP (OPHTHALMIC RELATED) ×2 IMPLANT
PICK MICROPICK 25G (MISCELLANEOUS) IMPLANT
PROBE LASER ILLUM FLEX CVD 25G (OPHTHALMIC) ×2 IMPLANT
ROLLS DENTAL (MISCELLANEOUS) IMPLANT
SCRAPER DIAMOND 25GA (OPHTHALMIC RELATED) IMPLANT
STOCKINETTE IMPERVIOUS 9X36 MD (GAUZE/BANDAGES/DRESSINGS) ×6 IMPLANT
STOPCOCK 4 WAY LG BORE MALE ST (IV SETS) IMPLANT
SUT ETHILON 8 0 TG100 8 (SUTURE) IMPLANT
SUT VICRYL 7 0 TG140 8 (SUTURE) IMPLANT
SUT VICRYL 8 0 TG140 8 (SUTURE) IMPLANT
SUT VICRYL ABS 6-0 S29 18IN (SUTURE) IMPLANT
SYR 20CC LL (SYRINGE) ×3 IMPLANT
SYR 5ML LL (SYRINGE) ×3 IMPLANT
SYR BULB 3OZ (MISCELLANEOUS) ×2 IMPLANT
SYR TB 1ML LUER SLIP (SYRINGE) ×2 IMPLANT
SYRINGE 10CC LL (SYRINGE) ×2 IMPLANT
TOWEL OR 17X24 6PK STRL BLUE (TOWEL DISPOSABLE) ×3 IMPLANT
TUBE CONNECTING 12'X1/4 (SUCTIONS)
TUBE CONNECTING 12X1/4 (SUCTIONS) IMPLANT
WATER STERILE IRR 1000ML POUR (IV SOLUTION) ×3 IMPLANT

## 2016-01-05 NOTE — Anesthesia Preprocedure Evaluation (Addendum)
Anesthesia Evaluation  Patient identified by MRN, date of birth, ID band Patient awake    Reviewed: Allergy & Precautions, NPO status , Patient's Chart, lab work & pertinent test results  History of Anesthesia Complications Negative for: history of anesthetic complications  Airway Mallampati: II  TM Distance: >3 FB Neck ROM: Full    Dental  (+) Dental Advisory Given, Upper Dentures, Lower Dentures   Pulmonary neg shortness of breath, neg sleep apnea, neg COPD, neg recent URI, former smoker,    Pulmonary exam normal breath sounds clear to auscultation       Cardiovascular (-) hypertension(-) angina(-) Past MI, (-) Cardiac Stents, (-) CABG, (-) Orthopnea and (-) PND  Rhythm:Regular Rate:Normal     Neuro/Psych  Headaches, neg Seizures    GI/Hepatic Neg liver ROS, GERD  Controlled,  Endo/Other  negative endocrine ROS  Renal/GU negative Renal ROS     Musculoskeletal   Abdominal   Peds  Hematology negative hematology ROS (+)   Anesthesia Other Findings   Reproductive/Obstetrics                            Anesthesia Physical Anesthesia Plan  ASA: III  Anesthesia Plan: General   Post-op Pain Management:    Induction: Intravenous  Airway Management Planned: Oral ETT  Additional Equipment:   Intra-op Plan:   Post-operative Plan: Extubation in OR  Informed Consent: I have reviewed the patients History and Physical, chart, labs and discussed the procedure including the risks, benefits and alternatives for the proposed anesthesia with the patient or authorized representative who has indicated his/her understanding and acceptance.   Dental advisory given  Plan Discussed with: CRNA  Anesthesia Plan Comments: (Risks of general anesthesia discussed including, but not limited to, sore throat, hoarse voice, chipped/damaged teeth, injury to vocal cords, nausea and vomiting, allergic reactions,  lung infection, heart attack, stroke, and death. All questions answered. )        Anesthesia Quick Evaluation

## 2016-01-05 NOTE — Anesthesia Procedure Notes (Signed)
Procedure Name: Intubation Date/Time: 01/05/2016 5:17 PM Performed by: De NurseENNIE, Jalayne Ganesh E Pre-anesthesia Checklist: Patient identified, Emergency Drugs available, Suction available and Patient being monitored Patient Re-evaluated:Patient Re-evaluated prior to inductionOxygen Delivery Method: Circle System Utilized Preoxygenation: Pre-oxygenation with 100% oxygen Intubation Type: IV induction Ventilation: Mask ventilation without difficulty Laryngoscope Size: Mac and 3 Grade View: Grade I Tube type: Oral Tube size: 7.0 mm Number of attempts: 1 Airway Equipment and Method: Stylet and Oral airway Placement Confirmation: ETT inserted through vocal cords under direct vision,  positive ETCO2 and breath sounds checked- equal and bilateral Secured at: 21 cm Tube secured with: Tape Dental Injury: Teeth and Oropharynx as per pre-operative assessment

## 2016-01-05 NOTE — Transfer of Care (Signed)
Immediate Anesthesia Transfer of Care Note  Patient: Tara PlattNancy V [name]  Procedure(s) Performed: Procedure(s): PARS PLANA VITRECTOMY WITH 25 GAUGE (Right) GAS/FLUID EXCHANGE (Right) LASER PHOTO ABLATION (Right)  Patient Location: PACU  Anesthesia Type:General  Level of Consciousness: awake, alert  and oriented  Airway & Oxygen Therapy: Patient Spontanous Breathing  Post-op Assessment: Report given to RN  Post vital signs: Reviewed and stable  Last Vitals:  Vitals:   01/05/16 1422  BP: 118/65  Pulse: 64  Resp: 18  Temp: 37.2 C    Last Pain:  Vitals:   01/05/16 1424  TempSrc:   PainSc: 2       Patients Stated Pain Goal: 4 (01/05/16 1424)  Complications: No apparent anesthesia complications

## 2016-01-05 NOTE — Anesthesia Postprocedure Evaluation (Signed)
Anesthesia Post Note  Patient: [name]  Procedure(s) Performed: Procedure(s) (LRB): PARS PLANA VITRECTOMY WITH 25 GAUGE (Right) GAS/FLUID EXCHANGE (Right) LASER PHOTO ABLATION (Right)  Patient location during evaluation: PACU Anesthesia Type: General Level of consciousness: awake Pain management: pain level controlled Vital Signs Assessment: post-procedure vital signs reviewed and stable Respiratory status: spontaneous breathing Cardiovascular status: stable Anesthetic complications: no    Last Vitals:  Vitals:   01/05/16 1845 01/05/16 1915  BP: 113/62 100/61  Pulse:  70  Resp:  16  Temp:      Last Pain:  Vitals:   01/05/16 1424  TempSrc:   PainSc: 2                  EDWARDS,[name]

## 2016-01-05 NOTE — Discharge Instructions (Signed)
Keep patch on. Keep head elevated or Right eye down. Follow-up with Dr. Allyne GeeSanders tomorrow.

## 2016-01-05 NOTE — H&P (Signed)
[name] is an 60 y.o. female.   Chief Complaint: vision loss HPI: RD OD  Past Medical History:  Diagnosis Date  . Anxiety   . Chest pain    5-6 years ago, EKG done and everything okay per pt report  . GERD (gastroesophageal reflux disease)   . Headache    migraines  . Thyroid disorder    pt unsure what was wrong, but it was a long time ago    Past Surgical History:  Procedure Laterality Date  . ABDOMINAL HYSTERECTOMY    . SCLERAL BUCKLE Right 12/18/2015   Procedure: SCLERAL BUCKLE with CRYO and GAS BUBBLE to RIGHT EYE;  Surgeon: Tara LiJason Marguerite Jarboe, MD;  [facility];  Service: Ophthalmology;  Laterality: Right;  . TONSILLECTOMY      History reviewed. No pertinent family history. Social History:  reports that she has quit smoking. Her smoking use included Cigarettes. She has never used smokeless tobacco. She reports that she does not drink alcohol or use drugs.  Allergies:  Allergies  Allergen Reactions  . Vicodin [Hydrocodone-Acetaminophen] Anaphylaxis and Swelling  . Bc Fast Pain [Aspirin-Salicylamide-Caffeine]     "messed up stomach and throat"    Medications Prior to Admission  Medication Sig Dispense Refill  . acetaminophen (TYLENOL) 500 MG tablet Take 500 mg by mouth every 4 (four) hours as needed.    . calcium carbonate (TUMS EX) 750 MG chewable tablet Chew 1 tablet by mouth 2 (two) times daily as needed for heartburn.    . ciprofloxacin (CILOXAN) 0.3 % ophthalmic solution Place 1 drop into the right eye 4 (four) times daily. Administer 1 drop, every 2 hours, while awake, for 2 days. Then 1 drop, every 4 hours, while awake, for the next 5 days.    . hydroxypropyl methylcellulose / hypromellose (ISOPTO TEARS / GONIOVISC) 2.5 % ophthalmic solution Place 1 drop into both eyes 4 (four) times daily as needed for dry eyes.    Marland Kitchen. ibuprofen (ADVIL,MOTRIN) 200 MG tablet Take 200 mg by mouth every 4 (four) hours as needed.     . Multiple Vitamins-Minerals (MULTIVITAMIN PO)  Take 1 tablet by mouth daily.    . prednisoLONE acetate (PRED FORTE) 1 % ophthalmic suspension Place 1 drop into the right eye 2 (two) times daily.   5    Results for orders placed or performed during the hospital encounter of 01/05/16 (from the past 48 hour(s))  CBC     Status: None   Collection Time: 01/05/16  2:17 PM  Result Value Ref Range   WBC 5.9 4.0 - 10.5 K/uL   RBC 4.74 3.87 - 5.11 MIL/uL   Hemoglobin 14.2 12.0 - 15.0 g/dL   HCT 16.143.4 09.636.0 - 04.546.0 %   MCV 91.6 78.0 - 100.0 fL   MCH 30.0 26.0 - 34.0 pg   MCHC 32.7 30.0 - 36.0 g/dL   RDW 40.912.6 81.111.5 - 91.415.5 %   Platelets 274 150 - 400 K/uL   No results found.  Review of Systems  All other systems reviewed and are negative.   Blood pressure 118/65, pulse 64, temperature 98.9 F (37.2 C), temperature source Oral, resp. rate 18, height 5\' 6"  (1.676 m), weight 79.4 kg (175 lb), SpO2 97 %. Physical Exam  Constitutional: She appears well-developed and well-nourished.  Eyes:       Assessment/Plan 1. RD s/p SBP OD: PPV/GFX/EL OD  Donnel SaxonSANDERS,[name], MD 01/05/2016, 5:07 PM

## 2016-01-05 NOTE — Brief Op Note (Signed)
01/05/2016  6:30 PM  PATIENT:  Trena PlattNancy V Cregger  60 y.o. female  PRE-OPERATIVE DIAGNOSIS:  Vitreous Heme   POST-OPERATIVE DIAGNOSIS:  Vitreous Heme   PROCEDURE:  Procedure(s): PARS PLANA VITRECTOMY WITH 25 GAUGE (Right) GAS/FLUID EXCHANGE (Right) LASER PHOTO ABLATION (Right)  SURGEON:  Surgeon(s) and Role:    * Stephannie LiJason Maxie Slovacek, MD - Primary  PHYSICIAN ASSISTANT:   ASSISTANTS: none   ANESTHESIA:   general  EBL:  Total I/O In: 750 [I.V.:750] Out: -   BLOOD ADMINISTERED:none  DRAINS: none   LOCAL MEDICATIONS USED:  BUPIVICAINE   SPECIMEN:  No Specimen  DISPOSITION OF SPECIMEN:  N/A  COUNTS:  YES  TOURNIQUET:  * No tourniquets in log *  DICTATION: .Other Dictation: Dictation Number [id number]  PLAN OF CARE: Discharge to home after PACU  PATIENT DISPOSITION:  PACU - hemodynamically stable.   Delay start of Pharmacological VTE agent (>24hrs) due to surgical blood loss or risk of bleeding: not applicable

## 2016-01-06 ENCOUNTER — Encounter (HOSPITAL_COMMUNITY): Payer: Self-pay | Admitting: Ophthalmology

## 2016-01-06 NOTE — Op Note (Signed)
[name], [name] NO.:  [id number]  MEDICAL RECORD NO.:  [id number]  LOCATION:  [facility]                         FACILITY:  [facility]  PHYSICIAN:  Donnel Saxon, MD    DATE OF BIRTH:  27-Jun-1955  DATE OF PROCEDURE:  01/05/2016 DATE OF DISCHARGE:  01/05/2016                              OPERATIVE REPORT   ANESTHESIA:  General with endotracheal intubation.  PREOPERATIVE DIAGNOSIS:  Retinal detachment, right eye.  POSTOPERATIVE DIAGNOSIS:  Retinal detachment, right eye.  OPERATIVE PROCEDURE:  A 25-gauge pars plana vitrectomy with endocautery, endolaser, air-fluid exchange, gas-fluid exchange using 14% C3F8; all for the right eye.  COMPLICATIONS:  None.  FINDINGS:  There was a retinal detachment right eye in the nasal retinal break at 3 o'clock.  DESCRIPTION OF PROCEDURE:  The patient was identified in the preoperative holding area.  She was then taken to the operating room where she was sedated and placed under general anesthesia.  A point on the right eye was anesthetized using retrobulbar block consisting of a 1:1 mixture of 0.75% bupivacaine and 1% lidocaine with 150 units of Halonix.  After the block was placed the needle was removed.  The right eye was then prepped and draped in usual sterile fashion for ocular surgery including trimming of lashes.  A Lieberman speculum was placed between the right upper and lower eyelids for exposure.  A 25-gauge trocar was then used to introduce a 25-gauge cannulas into the inferior nasal, superior nasal, and superior temporal quadrants.  The trocars were removed leaving the cannulas in place.  An infusion cannula was attached in the inferior temporal location and confirmed to be inside the vitreous cavity prior to its use.  The eye then underwent a core vitrectomy with vitreous cutter and light pipe.  Vitreous was trimmed out to the periphery.  Once this was completed, a single retinal break was identified in 3  o'clock position in the nasal periphery of the right eye.  This was marked using endocautery.  Next, a soft tip extrusion cannula was used to perform an air-fluid exchange.  There was residual posterior subretinal fluid that remained after the air-fluid exchange. Therefore, endocautery probe was used to perform a small retinotomy in the nasal periphery.  Once this was completed, the endocautery probe was removed.  The soft tip extrusion cannula was then used to extrude the remaining subretinal fluid from the subretinal space.  Once this was completed, the retina flattened completely.  Next, endolaser photocoagulation was placed around the retinal break at 3 o'clock and a small posterior retinotomy.  Once this was finished the eye was flushed with a 14% concentration of C3F8 gas.  Next, the cannula was removed from the sclerotomies.  The sclerotomies were inspected and found to be watertight.  The eye was inspected and found to have normal pressure. Next, the eye was treated with subconjunctival injections of 15 mg of Ancef and 1 mg of dexamethasone.  The eye was treated topically with 1 drop of 1% atropine and TobraDex ointment.  The speculum was removed.  The eyelids were cleaned and closed.  They were then patched and shielded.  The patient was then taken  to recovery in excellent condition having awoken from general anesthesia without difficulties.     Donnel SaxonJason B Lavalle Skoda, MD     JBS/MEDQ  D:  01/05/2016  T:  01/06/2016  Job:  [id number]

## 2016-02-18 ENCOUNTER — Other Ambulatory Visit: Payer: Self-pay | Admitting: Ophthalmology

## 2016-02-18 NOTE — Progress Notes (Signed)
Unable to reach pt by phone for pre-op call. Left pre-op instructions on pt's voicemail 

## 2016-02-19 ENCOUNTER — Ambulatory Visit (HOSPITAL_COMMUNITY): Payer: Self-pay | Admitting: Anesthesiology

## 2016-02-19 ENCOUNTER — Ambulatory Visit (HOSPITAL_COMMUNITY)
Admission: RE | Admit: 2016-02-19 | Discharge: 2016-02-19 | Disposition: A | Discharge status: Home / Self Care | Payer: Self-pay | Source: Ambulatory Visit | Attending: Ophthalmology | Admitting: Ophthalmology

## 2016-02-19 ENCOUNTER — Encounter (HOSPITAL_COMMUNITY): Payer: Self-pay | Admitting: Urology

## 2016-02-19 ENCOUNTER — Encounter (HOSPITAL_COMMUNITY)
Admission: RE | Disposition: A | Discharge status: Home / Self Care | Payer: Self-pay | Source: Ambulatory Visit | Attending: Ophthalmology

## 2016-02-19 DIAGNOSIS — K219 Gastro-esophageal reflux disease without esophagitis: Secondary | ICD-10-CM | POA: Insufficient documentation

## 2016-02-19 DIAGNOSIS — Z885 Allergy status to narcotic agent status: Secondary | ICD-10-CM | POA: Insufficient documentation

## 2016-02-19 DIAGNOSIS — H538 Other visual disturbances: Secondary | ICD-10-CM | POA: Insufficient documentation

## 2016-02-19 DIAGNOSIS — E079 Disorder of thyroid, unspecified: Secondary | ICD-10-CM | POA: Insufficient documentation

## 2016-02-19 DIAGNOSIS — Z87891 Personal history of nicotine dependence: Secondary | ICD-10-CM | POA: Insufficient documentation

## 2016-02-19 DIAGNOSIS — Z886 Allergy status to analgesic agent status: Secondary | ICD-10-CM | POA: Insufficient documentation

## 2016-02-19 DIAGNOSIS — H3341 Traction detachment of retina, right eye: Secondary | ICD-10-CM | POA: Insufficient documentation

## 2016-02-19 DIAGNOSIS — H33011 Retinal detachment with single break, right eye: Secondary | ICD-10-CM | POA: Insufficient documentation

## 2016-02-19 DIAGNOSIS — Z90710 Acquired absence of both cervix and uterus: Secondary | ICD-10-CM | POA: Insufficient documentation

## 2016-02-19 DIAGNOSIS — H3321 Serous retinal detachment, right eye: Secondary | ICD-10-CM

## 2016-02-19 DIAGNOSIS — Z79899 Other long term (current) drug therapy: Secondary | ICD-10-CM | POA: Insufficient documentation

## 2016-02-19 DIAGNOSIS — F419 Anxiety disorder, unspecified: Secondary | ICD-10-CM | POA: Insufficient documentation

## 2016-02-19 HISTORY — PX: INJECTION OF SILICONE OIL: SHX6422

## 2016-02-19 HISTORY — PX: LASER PHOTO ABLATION: SHX5942

## 2016-02-19 LAB — CBC
HCT: 42.4 % (ref 36.0–46.0)
HEMOGLOBIN: 13.9 g/dL (ref 12.0–15.0)
MCH: 30 pg (ref 26.0–34.0)
MCHC: 32.8 g/dL (ref 30.0–36.0)
MCV: 91.4 fL (ref 78.0–100.0)
Platelets: 270 10*3/uL (ref 150–400)
RBC: 4.64 MIL/uL (ref 3.87–5.11)
RDW: 13.3 % (ref 11.5–15.5)
WBC: 5.5 10*3/uL (ref 4.0–10.5)

## 2016-02-19 SURGERY — PARS PLANA VITRECTOMY 27 GAUGE
Anesthesia: General | Site: Eye | Laterality: Right

## 2016-02-19 MED ORDER — MIDAZOLAM HCL 2 MG/2ML IJ SOLN
INTRAMUSCULAR | Status: AC
  Filled 2016-02-19: qty 2

## 2016-02-19 MED ORDER — PROPOFOL 10 MG/ML IV BOLUS
INTRAVENOUS | Status: AC
  Filled 2016-02-19: qty 20

## 2016-02-19 MED ORDER — LIDOCAINE HCL 2 % IJ SOLN
INTRAMUSCULAR | Status: AC
  Filled 2016-02-19: qty 20

## 2016-02-19 MED ORDER — PHENYLEPHRINE 40 MCG/ML (10ML) SYRINGE FOR IV PUSH (FOR BLOOD PRESSURE SUPPORT)
PREFILLED_SYRINGE | INTRAVENOUS | Status: AC
  Filled 2016-02-19: qty 20

## 2016-02-19 MED ORDER — DEXAMETHASONE SODIUM PHOSPHATE 10 MG/ML IJ SOLN
INTRAMUSCULAR | Status: DC | PRN
  Administered 2016-02-19: 10 mg via INTRAVENOUS

## 2016-02-19 MED ORDER — EPHEDRINE SULFATE 50 MG/ML IJ SOLN
INTRAMUSCULAR | Status: DC | PRN
  Administered 2016-02-19: 10 mg via INTRAVENOUS

## 2016-02-19 MED ORDER — FENTANYL CITRATE (PF) 100 MCG/2ML IJ SOLN
INTRAMUSCULAR | Status: AC
  Filled 2016-02-19: qty 2

## 2016-02-19 MED ORDER — BSS PLUS IO SOLN
INTRAOCULAR | Status: AC
  Filled 2016-02-19: qty 500

## 2016-02-19 MED ORDER — PHENYLEPHRINE HCL 2.5 % OP SOLN
OPHTHALMIC | Status: AC
  Administered 2016-02-19: 1 [drp] via OPHTHALMIC
  Filled 2016-02-19: qty 2

## 2016-02-19 MED ORDER — OXYCODONE-ACETAMINOPHEN 5-325 MG PO TABS: 1.0000 | ORAL_TABLET | ORAL | 0 refills | Status: DC | PRN

## 2016-02-19 MED ORDER — EPINEPHRINE PF 1 MG/ML IJ SOLN
INTRAOCULAR | Status: DC | PRN
  Administered 2016-02-19: 500 mL

## 2016-02-19 MED ORDER — MIDAZOLAM HCL 5 MG/5ML IJ SOLN
INTRAMUSCULAR | Status: DC | PRN
  Administered 2016-02-19: 2 mg via INTRAVENOUS

## 2016-02-19 MED ORDER — DEXAMETHASONE SODIUM PHOSPHATE 10 MG/ML IJ SOLN
INTRAMUSCULAR | Status: AC
  Filled 2016-02-19: qty 1

## 2016-02-19 MED ORDER — TOBRAMYCIN-DEXAMETHASONE 0.3-0.1 % OP OINT
TOPICAL_OINTMENT | OPHTHALMIC | Status: DC | PRN
  Administered 2016-02-19: 1 via OPHTHALMIC

## 2016-02-19 MED ORDER — ATROPINE SULFATE 1 % OP SOLN
1.0000 [drp] | OPHTHALMIC | Status: AC
  Administered 2016-02-19 (×3): 1 [drp] via OPHTHALMIC

## 2016-02-19 MED ORDER — PHENYLEPHRINE HCL 10 MG/ML IJ SOLN
INTRAMUSCULAR | Status: DC | PRN
  Administered 2016-02-19 (×4): 80 ug via INTRAVENOUS

## 2016-02-19 MED ORDER — HYPROMELLOSE (GONIOSCOPIC) 2.5 % OP SOLN
OPHTHALMIC | Status: AC
  Filled 2016-02-19: qty 15

## 2016-02-19 MED ORDER — BSS IO SOLN
INTRAOCULAR | Status: AC
  Filled 2016-02-19: qty 15

## 2016-02-19 MED ORDER — ATROPINE SULFATE 1 % OP SOLN
OPHTHALMIC | Status: AC
  Filled 2016-02-19: qty 5

## 2016-02-19 MED ORDER — EPINEPHRINE PF 1 MG/ML IJ SOLN
INTRAMUSCULAR | Status: AC
  Filled 2016-02-19: qty 1

## 2016-02-19 MED ORDER — SODIUM CHLORIDE 0.9 % IV SOLN: INTRAVENOUS | Status: DC

## 2016-02-19 MED ORDER — TOBRAMYCIN-DEXAMETHASONE 0.3-0.1 % OP OINT
TOPICAL_OINTMENT | OPHTHALMIC | Status: AC
  Filled 2016-02-19: qty 3.5

## 2016-02-19 MED ORDER — TETRACAINE HCL 0.5 % OP SOLN
OPHTHALMIC | Status: AC
  Filled 2016-02-19: qty 2

## 2016-02-19 MED ORDER — HYALURONIDASE HUMAN 150 UNIT/ML IJ SOLN
INTRAMUSCULAR | Status: AC
  Filled 2016-02-19: qty 1

## 2016-02-19 MED ORDER — LIDOCAINE 2% (20 MG/ML) 5 ML SYRINGE
INTRAMUSCULAR | Status: DC | PRN
  Administered 2016-02-19: 60 mg via INTRAVENOUS

## 2016-02-19 MED ORDER — HYDROMORPHONE HCL 1 MG/ML IJ SOLN: 0.2500 mg | INTRAMUSCULAR | Status: DC | PRN

## 2016-02-19 MED ORDER — ONDANSETRON HCL 4 MG/2ML IJ SOLN
INTRAMUSCULAR | Status: AC
  Filled 2016-02-19: qty 2

## 2016-02-19 MED ORDER — SUGAMMADEX SODIUM 200 MG/2ML IV SOLN
INTRAVENOUS | Status: DC | PRN
  Administered 2016-02-19: 200 mg via INTRAVENOUS

## 2016-02-19 MED ORDER — FENTANYL CITRATE (PF) 100 MCG/2ML IJ SOLN
INTRAMUSCULAR | Status: DC | PRN
  Administered 2016-02-19 (×2): 50 ug via INTRAVENOUS

## 2016-02-19 MED ORDER — PHENYLEPHRINE HCL 2.5 % OP SOLN
1.0000 [drp] | OPHTHALMIC | Status: AC
  Administered 2016-02-19 (×3): 1 [drp] via OPHTHALMIC

## 2016-02-19 MED ORDER — ATROPINE SULFATE 1 % OP SOLN
OPHTHALMIC | Status: AC
  Administered 2016-02-19: 1 [drp] via OPHTHALMIC
  Filled 2016-02-19: qty 5

## 2016-02-19 MED ORDER — PROMETHAZINE HCL 25 MG/ML IJ SOLN: 6.2500 mg | INTRAMUSCULAR | Status: DC | PRN

## 2016-02-19 MED ORDER — CEFAZOLIN SUBCONJUNCTIVAL INJECTION 100 MG/0.5 ML
INJECTION | SUBCONJUNCTIVAL | Status: DC | PRN
  Administered 2016-02-19: 100 mg via SUBCONJUNCTIVAL

## 2016-02-19 MED ORDER — LACTATED RINGERS IV SOLN
INTRAVENOUS | Status: DC | PRN
  Administered 2016-02-19 (×2): via INTRAVENOUS

## 2016-02-19 MED ORDER — BALANCED SALT IO SOLN
INTRAOCULAR | Status: DC | PRN
  Administered 2016-02-19: 15 mL via INTRAOCULAR

## 2016-02-19 MED ORDER — ROCURONIUM BROMIDE 10 MG/ML (PF) SYRINGE
PREFILLED_SYRINGE | INTRAVENOUS | Status: DC | PRN
  Administered 2016-02-19: 50 mg via INTRAVENOUS
  Administered 2016-02-19 (×2): 10 mg via INTRAVENOUS

## 2016-02-19 MED ORDER — EPHEDRINE 5 MG/ML INJ
INTRAVENOUS | Status: AC
  Filled 2016-02-19: qty 20

## 2016-02-19 MED ORDER — PROPOFOL 10 MG/ML IV BOLUS
INTRAVENOUS | Status: DC | PRN
  Administered 2016-02-19: 150 mg via INTRAVENOUS

## 2016-02-19 MED ORDER — LIDOCAINE HCL 2 % IJ SOLN
INTRAMUSCULAR | Status: DC | PRN
  Administered 2016-02-19: 6 mL via RETROBULBAR

## 2016-02-19 MED ORDER — BUPIVACAINE HCL (PF) 0.75 % IJ SOLN
INTRAMUSCULAR | Status: AC
  Filled 2016-02-19: qty 10

## 2016-02-19 MED ORDER — CEFAZOLIN SUBCONJUNCTIVAL INJECTION 100 MG/0.5 ML
100.0000 mg | INJECTION | SUBCONJUNCTIVAL | Status: DC
  Filled 2016-02-19: qty 5

## 2016-02-19 SURGICAL SUPPLY — 68 items
BLADE MINI 60D BLUE (BLADE) ×2 IMPLANT
BLADE MVR KNIFE 20G (BLADE) IMPLANT
CANNULA ANT CHAM MAIN (OPHTHALMIC RELATED) IMPLANT
CANNULA ANTERIOR CHAMBER 27GA (MISCELLANEOUS) IMPLANT
CANNULA DUAL BORE 23G (CANNULA) IMPLANT
CANNULA VLV SOFT TIP 25G (OPHTHALMIC) ×1 IMPLANT
CANNULA VLV SOFT TIP 25GA (OPHTHALMIC) ×3 IMPLANT
CAUTERY EYE LOW TEMP 1300F FIN (OPHTHALMIC RELATED) IMPLANT
CLOSURE STERI-STRIP 1/2X4 (GAUZE/BANDAGES/DRESSINGS) ×1
CLSR STERI-STRIP ANTIMIC 1/2X4 (GAUZE/BANDAGES/DRESSINGS) ×2 IMPLANT
CORDS BIPOLAR (ELECTRODE) ×2 IMPLANT
COVER MAYO STAND STRL (DRAPES) ×2 IMPLANT
DRAPE INCISE 51X51 W/FILM STRL (DRAPES) IMPLANT
DRAPE PROXIMA HALF (DRAPES) ×3 IMPLANT
DRAPE RETRACTOR (MISCELLANEOUS) ×3 IMPLANT
ERASER HMR WETFIELD 23G BP (MISCELLANEOUS) IMPLANT
FILTER BLUE MILLIPORE (MISCELLANEOUS) IMPLANT
FORCEPS ECKARDT ILM 25G SERR (OPHTHALMIC RELATED) IMPLANT
FORCEPS GRIESHABER ILM 25G A (INSTRUMENTS) IMPLANT
GAS AUTO FILL CONSTEL (OPHTHALMIC)
GAS AUTO FILL CONSTELLATION (OPHTHALMIC) IMPLANT
GLOVE BIO SURGEON STRL SZ7.5 (GLOVE) ×3 IMPLANT
GOWN STRL REUS W/ TWL LRG LVL3 (GOWN DISPOSABLE) ×2 IMPLANT
GOWN STRL REUS W/TWL LRG LVL3 (GOWN DISPOSABLE) ×6
HANDLE PNEUMATIC FOR CONSTEL (OPHTHALMIC) IMPLANT
KIT BASIN OR (CUSTOM PROCEDURE TRAY) ×3 IMPLANT
KIT ROOM TURNOVER OR (KITS) IMPLANT
LENS BIOM SUPER VIEW SET DISP (OPHTHALMIC RELATED) ×3 IMPLANT
MICROPICK 25G (MISCELLANEOUS)
NDL 18GX1X1/2 (RX/OR ONLY) (NEEDLE) ×1 IMPLANT
NDL 25GX 5/8IN NON SAFETY (NEEDLE) ×1 IMPLANT
NDL FILTER BLUNT 18X1 1/2 (NEEDLE) IMPLANT
NDL HYPO 25GX1X1/2 BEV (NEEDLE) IMPLANT
NDL HYPO 30X.5 LL (NEEDLE) ×1 IMPLANT
NDL RETROBULBAR 25GX1.5 (NEEDLE) ×1 IMPLANT
NEEDLE 18GX1X1/2 (RX/OR ONLY) (NEEDLE) ×3 IMPLANT
NEEDLE 25GX 5/8IN NON SAFETY (NEEDLE) ×3 IMPLANT
NEEDLE FILTER BLUNT 18X 1/2SAF (NEEDLE)
NEEDLE FILTER BLUNT 18X1 1/2 (NEEDLE) IMPLANT
NEEDLE HYPO 25GX1X1/2 BEV (NEEDLE) IMPLANT
NEEDLE HYPO 30X.5 LL (NEEDLE) ×3 IMPLANT
NEEDLE RETROBULBAR 25GX1.5 (NEEDLE) ×3 IMPLANT
NS IRRIG 1000ML POUR BTL (IV SOLUTION) ×3 IMPLANT
OIL SILICONE OPHTHALMIC 1000 (Ophthalmic Related) ×2 IMPLANT
PAD ARMBOARD 7.5X6 YLW CONV (MISCELLANEOUS) ×6 IMPLANT
PAK PIK VITRECTOMY CVS 25GA (OPHTHALMIC) ×3 IMPLANT
PAK VITRECTOMY PIK  27GA (OPHTHALMIC)
PAK VITRECTOMY PIK 27GA (OPHTHALMIC) IMPLANT
PENCIL BIPOLAR 25GA STR DISP (OPHTHALMIC RELATED) ×2 IMPLANT
PICK MICROPICK 25G (MISCELLANEOUS) IMPLANT
PROBE LASER ILLUM FLEX CVD 25G (OPHTHALMIC) ×2 IMPLANT
ROLLS DENTAL (MISCELLANEOUS) IMPLANT
SCRAPER DIAMOND 25GA (OPHTHALMIC RELATED) IMPLANT
SET INJECTOR OIL FLUID CONSTEL (OPHTHALMIC) ×2 IMPLANT
STOCKINETTE IMPERVIOUS 9X36 MD (GAUZE/BANDAGES/DRESSINGS) ×6 IMPLANT
STOPCOCK 4 WAY LG BORE MALE ST (IV SETS) IMPLANT
SUT ETHILON 8 0 TG100 8 (SUTURE) IMPLANT
SUT VICRYL 7 0 TG140 8 (SUTURE) ×2 IMPLANT
SUT VICRYL 8 0 TG140 8 (SUTURE) IMPLANT
SUT VICRYL ABS 6-0 S29 18IN (SUTURE) IMPLANT
SYR 20CC LL (SYRINGE) ×3 IMPLANT
SYR 5ML LL (SYRINGE) ×3 IMPLANT
SYR TB 1ML LUER SLIP (SYRINGE) ×2 IMPLANT
SYRINGE 10CC LL (SYRINGE) IMPLANT
TOWEL OR 17X24 6PK STRL BLUE (TOWEL DISPOSABLE) ×3 IMPLANT
TUBE CONNECTING 12'X1/4 (SUCTIONS)
TUBE CONNECTING 12X1/4 (SUCTIONS) IMPLANT
WATER STERILE IRR 1000ML POUR (IV SOLUTION) ×3 IMPLANT

## 2016-02-19 NOTE — Anesthesia Preprocedure Evaluation (Signed)
Anesthesia Evaluation  Patient identified by MRN, date of birth, ID band Patient awake    Reviewed: Allergy & Precautions, NPO status , Patient's Chart, lab work & pertinent test results  Airway Mallampati: II  TM Distance: >3 FB Neck ROM: Full    Dental  (+) Lower Dentures, Dental Advisory Given   Pulmonary neg pulmonary ROS, former smoker,    Pulmonary exam normal breath sounds clear to auscultation       Cardiovascular negative cardio ROS Normal cardiovascular exam Rhythm:Regular Rate:Normal     Neuro/Psych negative neurological ROS  negative psych ROS   GI/Hepatic negative GI ROS, Neg liver ROS,   Endo/Other  negative endocrine ROS  Renal/GU negative Renal ROS  negative genitourinary   Musculoskeletal negative musculoskeletal ROS (+)   Abdominal   Peds negative pediatric ROS (+)  Hematology negative hematology ROS (+)   Anesthesia Other Findings   Reproductive/Obstetrics negative OB ROS                             Anesthesia Physical Anesthesia Plan  ASA: II  Anesthesia Plan: General   Post-op Pain Management:    Induction: Intravenous  Airway Management Planned: Oral ETT  Additional Equipment:   Intra-op Plan:   Post-operative Plan: Extubation in OR  Informed Consent: I have reviewed the patients History and Physical, chart, labs and discussed the procedure including the risks, benefits and alternatives for the proposed anesthesia with the patient or authorized representative who has indicated his/her understanding and acceptance.   Dental advisory given  Plan Discussed with: CRNA and Surgeon  Anesthesia Plan Comments:         Anesthesia Quick Evaluation

## 2016-02-19 NOTE — Transfer of Care (Signed)
Immediate Anesthesia Transfer of Care Note  Patient: [name]  Procedure(s) Performed: Procedure(s): PARS PLANA VITRECTOMY 25 GAUGE WITH SILICONE OIL PLACEMENT, RETINECTOMY RIGHT EYE (Right) LASER PHOTO ABLATION OF RIGHT EYE (Right) INJECTION OF SILICONE OIL IN RIGHT EYE (Right)  Patient Location: PACU  Anesthesia Type:General  Level of Consciousness: awake, alert  and oriented  Airway & Oxygen Therapy: Patient Spontanous Breathing and Patient connected to nasal cannula oxygen  Post-op Assessment: Report given to RN, Post -op Vital signs reviewed and stable and Patient moving all extremities X 4  Post vital signs: Reviewed and stable  Last Vitals:  Vitals:   02/19/16 1216  BP: (!) 149/71  Pulse: 60  Resp: 18  Temp: 37.2 C    Last Pain:  Vitals:   02/19/16 1216  TempSrc: Oral         Complications: No apparent anesthesia complications

## 2016-02-19 NOTE — Progress Notes (Signed)
Pt states she occasionally has chest pains, but cannot tell me how often or how they feel. Right now c/o pain to left collar bone "aching" and hurts a little bit. VSS, pt c/o same chest pains at previous surgery and EKG was performed 12/18/15. Dr. Okey Dupreose notified

## 2016-02-19 NOTE — Brief Op Note (Signed)
02/19/2016  4:24 PM  PATIENT:  [name]  60 y.o. female  PRE-OPERATIVE DIAGNOSIS:  retinal detactment right eye  POST-OPERATIVE DIAGNOSIS:  retinal detactment right eye  PROCEDURE:  Procedure(s): PARS PLANA VITRECTOMY 25 GAUGE WITH SILICONE OIL PLACEMENT, RETINECTOMY RIGHT EYE (Right) LASER PHOTO ABLATION OF RIGHT EYE (Right) INJECTION OF SILICONE OIL IN RIGHT EYE (Right)  SURGEON:  Surgeon(s) and Role:    * Stephannie LiJason Burnice Vassel, MD - Primary  PHYSICIAN ASSISTANT:   ASSISTANTS: none   ANESTHESIA:  General  EBL:  Total I/O In: 1200 [I.V.:1200] Out: 1 [Blood:1]  BLOOD ADMINISTERED:none  DRAINS: none   LOCAL MEDICATIONS USED:  BUPIVICAINE   SPECIMEN:  No Specimen  DISPOSITION OF SPECIMEN:  N/A  COUNTS:  YES  TOURNIQUET:  * No tourniquets in log *  DICTATION: .Other Dictation: Dictation Number [id number]  PLAN OF CARE: Discharge to home after PACU  PATIENT DISPOSITION:  PACU - hemodynamically stable.   Delay start of Pharmacological VTE agent (>24hrs) due to surgical blood loss or risk of bleeding: not applicable

## 2016-02-19 NOTE — H&P (Signed)
Trena Plattancy V Kivi is an 60 y.o. female.   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 Anxiety   . Chest pain    5-6 years ago, EKG done and everything okay per pt report  . GERD (gastroesophageal reflux disease)   . Headache    migraines  . Thyroid disorder    pt unsure what was wrong, but it was a long time ago    Past Surgical History:  Procedure Laterality Date  . ABDOMINAL HYSTERECTOMY    . GAS/FLUID EXCHANGE Right 01/05/2016   Procedure: GAS/FLUID EXCHANGE;  Surgeon: Stephannie LiJason Ziere Docken, MD;  [facility];  Service: Ophthalmology;  Laterality: Right;  . LASER PHOTO ABLATION Right 01/05/2016   Procedure: LASER PHOTO ABLATION;  Surgeon: Stephannie LiJason Yomira Flitton, MD;  [facility];  Service: Ophthalmology;  Laterality: Right;  . PARS PLANA VITRECTOMY Right 01/05/2016   Procedure: PARS PLANA VITRECTOMY WITH 25 GAUGE;  Surgeon: Stephannie LiJason Wen Munford, MD;  [facility];  Service: Ophthalmology;  Laterality: Right;  . SCLERAL BUCKLE Right 12/18/2015   Procedure: SCLERAL BUCKLE with CRYO and GAS BUBBLE to RIGHT EYE;  Surgeon: Stephannie LiJason Wiatt Mahabir, MD;  [facility];  Service: Ophthalmology;  Laterality: Right;  . TONSILLECTOMY      History reviewed. No pertinent family history. Social History:  reports that she has quit smoking. Her smoking use included Cigarettes. She has never used smokeless tobacco. She reports that she does not drink alcohol or use drugs.  Allergies:  Allergies  Allergen Reactions  . Vicodin [Hydrocodone-Acetaminophen] Anaphylaxis and Swelling  . Bc Fast Pain [Aspirin-Salicylamide-Caffeine] Other (See Comments)    "messed up stomach and throat"    Medications Prior to Admission  Medication Sig Dispense Refill  . acetaminophen (TYLENOL) 500 MG tablet Take 500 mg by mouth every 4 (four) hours as needed for moderate pain or headache.     . Besifloxacin HCl (BESIVANCE) 0.6 % SUSP Place 1 drop  into the right eye 4 (four) times daily.    . calcium carbonate (TUMS EX) 750 MG chewable tablet Chew 1 tablet by mouth 2 (two) times daily as needed for heartburn.    Marland Kitchen. ibuprofen (ADVIL,MOTRIN) 200 MG tablet Take 200 mg by mouth every 6 (six) hours as needed for headache or moderate pain.    . hydroxypropyl methylcellulose / hypromellose (ISOPTO TEARS / GONIOVISC) 2.5 % ophthalmic solution Place 1 drop into both eyes 4 (four) times daily as needed for dry eyes.      Results for orders placed or performed during the hospital encounter of 02/19/16 (from the past 48 hour(s))  CBC     Status: None   Collection Time: 02/19/16  1:04 PM  Result Value Ref Range   WBC 5.5 4.0 - 10.5 K/uL   RBC 4.64 3.87 - 5.11 MIL/uL   Hemoglobin 13.9 12.0 - 15.0 g/dL   HCT 16.142.4 09.636.0 - 04.546.0 %   MCV 91.4 78.0 - 100.0 fL   MCH 30.0 26.0 - 34.0 pg   MCHC 32.8 30.0 - 36.0 g/dL   RDW 40.913.3 81.111.5 - 91.415.5 %   Platelets 270 150 - 400 K/uL   No results found.  Review of Systems  Eyes: Positive for blurred vision.  All other systems reviewed and are negative.   Blood pressure (!) 149/71, pulse 60, temperature 98.9 F (37.2 C), temperature source Oral, resp. rate 18, height 5\' 6"  (  1.676 m), weight 78.9 kg (174 lb), SpO2 97 %. Physical Exam  Constitutional: She appears well-developed and well-nourished.  Eyes: Conjunctivae and EOM are normal. Pupils are equal, round, and reactive to light. Lids are everted and swept, no foreign bodies found.       Assessment/Plan 1. RD OD: PPV, retinectomy, Oil OD  Donnel SaxonSANDERS,Kahealani Yankovich B, MD 02/19/2016, 2:25 PM

## 2016-02-19 NOTE — Progress Notes (Signed)
Spoke with Tresa EndoKelly, RN at Dr. Allyne GeeSanders Office, reached Dr. Allyne GeeSanders by cell phone and eye drops ordered per Dr. Allyne GeeSanders.

## 2016-02-19 NOTE — Anesthesia Procedure Notes (Signed)
Procedure Name: Intubation Date/Time: 02/19/2016 2:39 PM Performed by: Carmela RimaMARTINELLI, Elwin Tsou F Pre-anesthesia Checklist: Timeout performed, Patient being monitored, Suction available, Emergency Drugs available and Patient identified Patient Re-evaluated:Patient Re-evaluated prior to inductionOxygen Delivery Method: Circle system utilized Preoxygenation: Pre-oxygenation with 100% oxygen Intubation Type: IV induction Ventilation: Mask ventilation without difficulty Laryngoscope Size: Mac and 3 Grade View: Grade I Tube type: Oral Tube size: 7.0 mm Number of attempts: 1 Placement Confirmation: breath sounds checked- equal and bilateral,  positive ETCO2 and ETT inserted through vocal cords under direct vision Secured at: 21 cm Tube secured with: Tape Dental Injury: Teeth and Oropharynx as per pre-operative assessment

## 2016-02-20 ENCOUNTER — Encounter (HOSPITAL_COMMUNITY): Payer: Self-pay | Admitting: Ophthalmology

## 2016-02-20 NOTE — Op Note (Signed)
NAMAshok Pall:  [place], [place]                ACCOUNT NO.:  [id number]  MEDICAL RECORD NO.:  [id number]  LOCATION:  [facility]                         FACILITY:  [facility]  PHYSICIAN:  Donnel SaxonJason B Annalese Stiner, MD    DATE OF BIRTH:  07/18/1955  DATE OF PROCEDURE:  02/19/2016 DATE OF DISCHARGE:  02/19/2016                              OPERATIVE REPORT   SURGEON:  Barbara CowerJason B. Allyne GeeSanders, MD  ANESTHESIA:  General anesthesia with endotracheal intubation.  PREOPERATIVE DIAGNOSIS:  Recurrent retinal detachment, right eye.  POSTOPERATIVE DIAGNOSIS:  Recurrent retinal detachment, right eye.  OPERATIVE PROCEDURE:  A pars plana vitrectomy with retinectomy, endo cautery, endolaser, air-fluid exchange, and silicone oil placement for the right eye.  COMPLICATIONS:  None.  FINDINGS:  There was advanced PVR and anterior contraction of necrotic retina.  The retinal detachment involved the temporal peripheral retina and the macula.  COMPLICATIONS:  None.  DESCRIPTION OF PROCEDURE:  Ms. Karna DupesGrogan was identified in the preoperative holding area.  All questions were answered, and then, she was taken to the operating room, where she was sedated and placed under general anesthesia.  At that point in time, the right eye was anesthetized using a retrobulbar block consisting of a 1:1 mixture of 0.75% lidocaine and 0.75% bupivacaine.  The retrobulbar needle was removed, and the right eye was then prepped and draped in usual sterile fashion for ocular surgery.  A Lieberman speculum was placed between the right upper and lower eyelids for exposure.  Next, a 25-gauge trocars were used to place transconjunctival cannulas in the inferotemporal, superotemporal, supranasal quadrants.  The trocar was removed leaving the cannula in place.  Once this was completed, the eye underwent a vitrectomy with vitreous cutter and light pipe.  Vitreous had been previously removed during previous surgeries.  Retinal break was identified at approximately 6  o'clock.  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 The eye was inspected.  No additional retinal breaks were found.  At this time, decision was made to perform a peripheral retinectomy to allow relaxation of the retina and reattachment of the macula. Therefore, endo cautery probe was used to demarcate an area of the retinectomy in the inferior temporal periphery.  Once this was completed, a vitrector was then used to excise the peripheral retina anterior to the Endo cautery retinectomy line.  This was performed without any difficulty, and this allowed the retina to relax and reposition back to the posterior eye wall.  Once this was completed, a soft tip extrusion cannula was used to perform an air-fluid exchange. This allowed the retina to secure nicely on the back wall of the retina.  Next, endolaser photocoagulation was used to treat the retina to secure the retinectomy.  This was demarcated with a broad band of coherent laser photocoagulation.  The endolaser probe was removed once the retinectomy was secured.  At this point in time, the retina was completely flat.  Therefore, a viscous fluid injector was used to inject 1000 centistokes of silicone oil into the vitreous cavity.  The oil was brought up to the lens iris diaphragm.  Peripheral iridectomy was not necessary as the eye  was phakic.  After this was performed, the viscous fluid injector was removed.  Next, the cannulas removed from the sclerotomies.  The Sclerotomies were stable and able to remain self- sealing.  Intra-ocular pressure was assessed and found to be approximately 10 mmHg.  The eye was then treated with subconjunctival injections of 15 mg of Ancef and 1 mg of dexamethasone.  The eye was treated topically with 1 drop of 1% atropine TobraDex ointment. Speculum was removed.  The eyelids were cleaned and closed and then patched and shielded.   The patient was then taken to recovery in excellent condition, having tolerated the procedure well.     Donnel SaxonJason B Karmyn Lowman, MD     JBS/MEDQ  D:  02/19/2016  T:  02/20/2016  Job:  [id number]

## 2016-02-20 NOTE — Anesthesia Postprocedure Evaluation (Signed)
Anesthesia Post Note  Patient: Trena Plattancy V Virrueta  Procedure(s) Performed: Procedure(s) (LRB): PARS PLANA VITRECTOMY 25 GAUGE WITH SILICONE OIL PLACEMENT, RETINECTOMY RIGHT EYE (Right) LASER PHOTO ABLATION OF RIGHT EYE (Right) INJECTION OF SILICONE OIL IN RIGHT EYE (Right)  Patient location during evaluation: PACU Anesthesia Type: General Level of consciousness: awake and alert Pain management: pain level controlled Vital Signs Assessment: post-procedure vital signs reviewed and stable Respiratory status: spontaneous breathing, nonlabored ventilation, respiratory function stable and patient connected to nasal cannula oxygen Cardiovascular status: blood pressure returned to baseline and stable Postop Assessment: no signs of nausea or vomiting Anesthetic complications: no    Last Vitals:  Vitals:   02/19/16 1649 02/19/16 1657  BP: 120/73 (P) 114/70  Pulse: 82 75  Resp: 12 18  Temp:      Last Pain:  Vitals:   02/19/16 1657  TempSrc:   PainSc: 0-No pain                 Cecile HearingStephen Edward Makale Pindell

## 2016-02-27 ENCOUNTER — Other Ambulatory Visit: Payer: Self-pay | Admitting: Ophthalmology

## 2016-05-28 ENCOUNTER — Other Ambulatory Visit: Payer: Self-pay | Admitting: Ophthalmology

## 2016-05-28 ENCOUNTER — Encounter (HOSPITAL_COMMUNITY): Payer: Self-pay | Admitting: *Deleted

## 2016-05-28 NOTE — Progress Notes (Signed)
Rica MastAngela Kabbe, NP, Anesthesia advised that pt EKG be repeated on DOS due to recent C/O chest pain.

## 2016-05-28 NOTE — Progress Notes (Signed)
Anesthesia asked to review pt history given recent C/O chest pain.

## 2016-05-28 NOTE — Progress Notes (Signed)
Pt denies that she is SOB but stated that she had chest pain " a couple of day ago, it was probably stress ."  Pt has a history of chest pain. Pt denies being under the care of a cardiologist. Pt denies having an echo and cardiac cath but stated that a stress test was performed > 10 years ago. Pt made aware to stop taking Aspirin, vitamins, fish oil and herbal medications. Do not take any NSAIDs ie: Ibuprofen, Advil, Naproxen, BC and Goody Powder or any medication containing Aspirin such as Excedrin Migraine. Pt verbalized understanding of all pre-op instructions.

## 2016-05-31 ENCOUNTER — Ambulatory Visit (HOSPITAL_COMMUNITY)
Admit: 2016-05-31 | Discharge: 2016-05-31 | Disposition: A | Discharge status: Home / Self Care | Payer: Self-pay | Source: Intra-hospital | Attending: Ophthalmology | Admitting: Ophthalmology

## 2016-05-31 ENCOUNTER — Encounter (HOSPITAL_COMMUNITY)
Disposition: A | Discharge status: Home / Self Care | Payer: Self-pay | Source: Intra-hospital | Attending: Ophthalmology

## 2016-05-31 ENCOUNTER — Ambulatory Visit (HOSPITAL_COMMUNITY): Payer: Self-pay | Admitting: Emergency Medicine

## 2016-05-31 DIAGNOSIS — K219 Gastro-esophageal reflux disease without esophagitis: Secondary | ICD-10-CM | POA: Insufficient documentation

## 2016-05-31 DIAGNOSIS — E079 Disorder of thyroid, unspecified: Secondary | ICD-10-CM | POA: Insufficient documentation

## 2016-05-31 DIAGNOSIS — Z885 Allergy status to narcotic agent status: Secondary | ICD-10-CM | POA: Insufficient documentation

## 2016-05-31 DIAGNOSIS — Z833 Family history of diabetes mellitus: Secondary | ICD-10-CM | POA: Insufficient documentation

## 2016-05-31 DIAGNOSIS — Z90710 Acquired absence of both cervix and uterus: Secondary | ICD-10-CM | POA: Insufficient documentation

## 2016-05-31 DIAGNOSIS — Z87891 Personal history of nicotine dependence: Secondary | ICD-10-CM | POA: Insufficient documentation

## 2016-05-31 DIAGNOSIS — Z79899 Other long term (current) drug therapy: Secondary | ICD-10-CM | POA: Insufficient documentation

## 2016-05-31 DIAGNOSIS — F419 Anxiety disorder, unspecified: Secondary | ICD-10-CM | POA: Insufficient documentation

## 2016-05-31 DIAGNOSIS — M1990 Unspecified osteoarthritis, unspecified site: Secondary | ICD-10-CM | POA: Insufficient documentation

## 2016-05-31 DIAGNOSIS — G43909 Migraine, unspecified, not intractable, without status migrainosus: Secondary | ICD-10-CM | POA: Insufficient documentation

## 2016-05-31 DIAGNOSIS — H3321 Serous retinal detachment, right eye: Secondary | ICD-10-CM | POA: Insufficient documentation

## 2016-05-31 DIAGNOSIS — Z7982 Long term (current) use of aspirin: Secondary | ICD-10-CM | POA: Insufficient documentation

## 2016-05-31 DIAGNOSIS — Z888 Allergy status to other drugs, medicaments and biological substances status: Secondary | ICD-10-CM | POA: Insufficient documentation

## 2016-05-31 HISTORY — PX: PARS PLANA VITRECTOMY: SHX2166

## 2016-05-31 LAB — CBC
HEMATOCRIT: 39.6 % (ref 36.0–46.0)
HEMOGLOBIN: 13 g/dL (ref 12.0–15.0)
MCH: 30.2 pg (ref 26.0–34.0)
MCHC: 32.8 g/dL (ref 30.0–36.0)
MCV: 91.9 fL (ref 78.0–100.0)
Platelets: 246 10*3/uL (ref 150–400)
RBC: 4.31 MIL/uL (ref 3.87–5.11)
RDW: 12.7 % (ref 11.5–15.5)
WBC: 5 10*3/uL (ref 4.0–10.5)

## 2016-05-31 LAB — BASIC METABOLIC PANEL
ANION GAP: 9 (ref 5–15)
BUN: 10 mg/dL (ref 6–20)
CHLORIDE: 108 mmol/L (ref 101–111)
CO2: 25 mmol/L (ref 22–32)
Calcium: 9.1 mg/dL (ref 8.9–10.3)
Creatinine, Ser: 0.66 mg/dL (ref 0.44–1.00)
GFR calc Af Amer: >60 mL/min (ref >60)
GFR calc non Af Amer: >60 mL/min (ref >60)
GLUCOSE: 88 mg/dL (ref 65–99)
POTASSIUM: 3.7 mmol/L (ref 3.5–5.1)
SODIUM: 142 mmol/L (ref 135–145)

## 2016-05-31 SURGERY — PARS PLANA VITRECTOMY WITH 25 GAUGE
Anesthesia: General | Site: Eye | Laterality: Right

## 2016-05-31 MED ORDER — TOBRAMYCIN-DEXAMETHASONE 0.3-0.1 % OP OINT
TOPICAL_OINTMENT | OPHTHALMIC | Status: AC
  Filled 2016-05-31: qty 3.5

## 2016-05-31 MED ORDER — PROPOFOL 10 MG/ML IV BOLUS
INTRAVENOUS | Status: DC | PRN
  Administered 2016-05-31: 100 mg via INTRAVENOUS
  Administered 2016-05-31: 20 mg via INTRAVENOUS

## 2016-05-31 MED ORDER — ATROPINE SULFATE 1 % OP SOLN
OPHTHALMIC | Status: AC
  Filled 2016-05-31: qty 5

## 2016-05-31 MED ORDER — GENTAMICIN SULFATE 40 MG/ML IJ SOLN
INTRAMUSCULAR | Status: AC
  Filled 2016-05-31: qty 2

## 2016-05-31 MED ORDER — LIDOCAINE HCL 2 % IJ SOLN
INTRAMUSCULAR | Status: DC | PRN
  Administered 2016-05-31: 6 mL via RETROBULBAR

## 2016-05-31 MED ORDER — ATROPINE SULFATE 1 % OP SOLN: 1.0000 [drp] | OPHTHALMIC | Status: DC

## 2016-05-31 MED ORDER — PHENYLEPHRINE HCL 2.5 % OP SOLN
1.0000 [drp] | OPHTHALMIC | Status: AC
  Administered 2016-05-31 (×3): 1 [drp] via OPHTHALMIC

## 2016-05-31 MED ORDER — ROCURONIUM BROMIDE 100 MG/10ML IV SOLN
INTRAVENOUS | Status: DC | PRN
Start: 2016-05-31 — End: 2016-05-31
  Administered 2016-05-31: 50 mg via INTRAVENOUS

## 2016-05-31 MED ORDER — SUGAMMADEX SODIUM 200 MG/2ML IV SOLN
INTRAVENOUS | Status: DC | PRN
  Administered 2016-05-31: 100 mg via INTRAVENOUS

## 2016-05-31 MED ORDER — FENTANYL CITRATE (PF) 100 MCG/2ML IJ SOLN
INTRAMUSCULAR | Status: AC
  Filled 2016-05-31: qty 4

## 2016-05-31 MED ORDER — SODIUM CHLORIDE 0.9 % IJ SOLN
INTRAMUSCULAR | Status: AC
  Filled 2016-05-31: qty 10

## 2016-05-31 MED ORDER — LIDOCAINE HCL (CARDIAC) 20 MG/ML IV SOLN
INTRAVENOUS | Status: DC | PRN
  Administered 2016-05-31: 60 mg via INTRAVENOUS

## 2016-05-31 MED ORDER — BSS PLUS IO SOLN
INTRAOCULAR | Status: AC
  Filled 2016-05-31: qty 500

## 2016-05-31 MED ORDER — EPINEPHRINE PF 1 MG/ML IJ SOLN
INTRAMUSCULAR | Status: DC | PRN
  Administered 2016-05-31: 12:00:00

## 2016-05-31 MED ORDER — SODIUM CHLORIDE 0.9 % IV SOLN
INTRAVENOUS | Status: DC | PRN
  Administered 2016-05-31: 12:00:00 via INTRAVENOUS

## 2016-05-31 MED ORDER — CEFAZOLIN SUBCONJUNCTIVAL INJECTION 100 MG/0.5 ML
100.0000 mg | INJECTION | SUBCONJUNCTIVAL | Status: AC
  Administered 2016-05-31: 40 mg via SUBCONJUNCTIVAL
  Filled 2016-05-31: qty 5

## 2016-05-31 MED ORDER — HYPROMELLOSE (GONIOSCOPIC) 2.5 % OP SOLN
OPHTHALMIC | Status: AC
  Filled 2016-05-31: qty 15

## 2016-05-31 MED ORDER — NA CHONDROIT SULF-NA HYALURON 40-30 MG/ML IO SOLN
INTRAOCULAR | Status: AC
  Filled 2016-05-31: qty 0.5

## 2016-05-31 MED ORDER — HYPROMELLOSE (GONIOSCOPIC) 2.5 % OP SOLN
OPHTHALMIC | Status: DC | PRN
Start: 2016-05-31 — End: 2016-05-31
  Administered 2016-05-31: 2 [drp] via OPHTHALMIC

## 2016-05-31 MED ORDER — PHENYLEPHRINE HCL 10 MG/ML IJ SOLN
INTRAVENOUS | Status: DC | PRN
  Administered 2016-05-31: 30 ug/min via INTRAVENOUS

## 2016-05-31 MED ORDER — LIDOCAINE HCL 2 % IJ SOLN
INTRAMUSCULAR | Status: AC
  Filled 2016-05-31: qty 20

## 2016-05-31 MED ORDER — PHENYLEPHRINE HCL 2.5 % OP SOLN
OPHTHALMIC | Status: AC
  Administered 2016-05-31: 1 [drp] via OPHTHALMIC
  Filled 2016-05-31: qty 2

## 2016-05-31 MED ORDER — SODIUM HYALURONATE 10 MG/ML IO SOLN
INTRAOCULAR | Status: AC
  Filled 2016-05-31: qty 0.85

## 2016-05-31 MED ORDER — EPINEPHRINE PF 1 MG/ML IJ SOLN
INTRAMUSCULAR | Status: AC
  Filled 2016-05-31: qty 1

## 2016-05-31 MED ORDER — PHENYLEPHRINE HCL 2.5 % OP SOLN: 1.0000 [drp] | OPHTHALMIC | Status: DC

## 2016-05-31 MED ORDER — HYALURONIDASE HUMAN 150 UNIT/ML IJ SOLN
INTRAMUSCULAR | Status: AC
  Filled 2016-05-31: qty 1

## 2016-05-31 MED ORDER — BUPIVACAINE HCL (PF) 0.75 % IJ SOLN
INTRAMUSCULAR | Status: AC
  Filled 2016-05-31: qty 10

## 2016-05-31 MED ORDER — DEXAMETHASONE SODIUM PHOSPHATE 10 MG/ML IJ SOLN
INTRAMUSCULAR | Status: AC
  Filled 2016-05-31: qty 1

## 2016-05-31 MED ORDER — ATROPINE SULFATE 1 % OP SOLN
OPHTHALMIC | Status: AC
  Administered 2016-05-31: 1 [drp] via OPHTHALMIC
  Filled 2016-05-31: qty 5

## 2016-05-31 MED ORDER — ONDANSETRON HCL 4 MG/2ML IJ SOLN
INTRAMUSCULAR | Status: DC | PRN
  Administered 2016-05-31: 4 mg via INTRAVENOUS

## 2016-05-31 MED ORDER — DEXAMETHASONE SODIUM PHOSPHATE 10 MG/ML IJ SOLN
INTRAMUSCULAR | Status: DC | PRN
  Administered 2016-05-31: 10 mg via INTRAVENOUS

## 2016-05-31 MED ORDER — TETRACAINE HCL 0.5 % OP SOLN
OPHTHALMIC | Status: AC
  Filled 2016-05-31: qty 2

## 2016-05-31 MED ORDER — MIDAZOLAM HCL 2 MG/2ML IJ SOLN
INTRAMUSCULAR | Status: AC
  Filled 2016-05-31: qty 2

## 2016-05-31 MED ORDER — FENTANYL CITRATE (PF) 100 MCG/2ML IJ SOLN
INTRAMUSCULAR | Status: DC | PRN
  Administered 2016-05-31: 100 ug via INTRAVENOUS

## 2016-05-31 MED ORDER — ATROPINE SULFATE 1 % OP SOLN
1.0000 [drp] | OPHTHALMIC | Status: AC
  Administered 2016-05-31 (×3): 1 [drp] via OPHTHALMIC

## 2016-05-31 MED ORDER — SODIUM CHLORIDE 0.9 % IV SOLN
Freq: Once | INTRAVENOUS | Status: AC
  Administered 2016-05-31: 11:00:00 via INTRAVENOUS

## 2016-05-31 MED ORDER — DEXAMETHASONE SODIUM PHOSPHATE 10 MG/ML IJ SOLN
INTRAMUSCULAR | Status: DC | PRN
  Administered 2016-05-31: .2 mL

## 2016-05-31 MED ORDER — CEFAZOLIN (ANCEF) 1 G IV SOLR
1.0000 g | INTRAVENOUS | Status: DC
  Filled 2016-05-31: qty 1

## 2016-05-31 MED ORDER — PROPOFOL 10 MG/ML IV BOLUS
INTRAVENOUS | Status: AC
  Filled 2016-05-31: qty 20

## 2016-05-31 MED ORDER — TOBRAMYCIN-DEXAMETHASONE 0.3-0.1 % OP OINT
TOPICAL_OINTMENT | OPHTHALMIC | Status: DC | PRN
Start: 2016-05-31 — End: 2016-05-31
  Administered 2016-05-31: 1 via OPHTHALMIC

## 2016-05-31 MED ORDER — MIDAZOLAM HCL 5 MG/5ML IJ SOLN
INTRAMUSCULAR | Status: DC | PRN
  Administered 2016-05-31: 2 mg via INTRAVENOUS

## 2016-05-31 MED ORDER — POLYMYXIN B SULFATE 500000 UNITS IJ SOLR
INTRAMUSCULAR | Status: AC
  Filled 2016-05-31: qty 500000

## 2016-05-31 SURGICAL SUPPLY — 22 items
CANNULA TROCAR 25 GA VLV (OPHTHALMIC) ×3 IMPLANT
CANNULA TROCAR 25G 4 VLV (OPHTHALMIC) IMPLANT
CANNULA VLV SOFT TIP 25G (OPHTHALMIC) ×1 IMPLANT
CANNULA VLV SOFT TIP 25GA (OPHTHALMIC) ×3 IMPLANT
DRAPE PROXIMA HALF (DRAPES) ×3 IMPLANT
GLOVE BIO SURGEON STRL SZ7.5 (GLOVE) ×3 IMPLANT
GOWN STRL REUS W/ TWL LRG LVL3 (GOWN DISPOSABLE) ×2 IMPLANT
GOWN STRL REUS W/TWL LRG LVL3 (GOWN DISPOSABLE) ×6
KIT BASIN OR (CUSTOM PROCEDURE TRAY) ×3 IMPLANT
KIT ROOM TURNOVER OR (KITS) ×2 IMPLANT
NDL 18GX1X1/2 (RX/OR ONLY) (NEEDLE) ×1 IMPLANT
NDL 25GX 5/8IN NON SAFETY (NEEDLE) ×1 IMPLANT
NDL FILTER BLUNT 18X1 1/2 (NEEDLE) IMPLANT
NEEDLE 18GX1X1/2 (RX/OR ONLY) (NEEDLE) ×3 IMPLANT
NEEDLE 25GX 5/8IN NON SAFETY (NEEDLE) ×3 IMPLANT
NEEDLE FILTER BLUNT 18X 1/2SAF (NEEDLE) ×2
NEEDLE FILTER BLUNT 18X1 1/2 (NEEDLE) ×1 IMPLANT
NS IRRIG 1000ML POUR BTL (IV SOLUTION) ×3 IMPLANT
PAD ARMBOARD 7.5X6 YLW CONV (MISCELLANEOUS) ×4 IMPLANT
PAK PIK VITRECTOMY CVS 25GA (OPHTHALMIC) ×3 IMPLANT
SYR TB 1ML LUER SLIP (SYRINGE) ×2 IMPLANT
TOWEL OR 17X24 6PK STRL BLUE (TOWEL DISPOSABLE) ×3 IMPLANT

## 2016-05-31 NOTE — H&P (Signed)
[name] is an 61 y.o. female.   Chief Complaint:decreased vision OD.  HPI: 3 month s/p retinal detachment repair   Past Medical History:  Diagnosis Date  . Anxiety   . Chest pain    5-6 years ago, EKG done and everything okay per pt report  . GERD (gastroesophageal reflux disease)   . Headache    migraines  . Thyroid disorder    pt unsure what was wrong, but it was a long time ago    Past Surgical History:  Procedure Laterality Date  . ABDOMINAL HYSTERECTOMY    . GAS/FLUID EXCHANGE Right 01/05/2016   Procedure: GAS/FLUID EXCHANGE;  Surgeon: Stephannie LiJason Chinedu Agustin, MD;  [facility];  Service: Ophthalmology;  Laterality: Right;  . INJECTION OF SILICONE OIL Right 02/19/2016   Procedure: INJECTION OF SILICONE OIL IN RIGHT EYE;  Surgeon: Stephannie LiJason Jadan Hinojos, MD;  [facility];  Service: Ophthalmology;  Laterality: Right;  . LASER PHOTO ABLATION Right 01/05/2016   Procedure: LASER PHOTO ABLATION;  Surgeon: Stephannie LiJason Zylah Elsbernd, MD;  [facility];  Service: Ophthalmology;  Laterality: Right;  . LASER PHOTO ABLATION Right 02/19/2016   Procedure: LASER PHOTO ABLATION OF RIGHT EYE;  Surgeon: Stephannie LiJason Peytan Andringa, MD;  [facility];  Service: Ophthalmology;  Laterality: Right;  . PARS PLANA VITRECTOMY Right 01/05/2016   Procedure: PARS PLANA VITRECTOMY WITH 25 GAUGE;  Surgeon: Stephannie LiJason Jamelah Sitzer, MD;  [facility];  Service: Ophthalmology;  Laterality: Right;  . SCLERAL BUCKLE Right 12/18/2015   Procedure: SCLERAL BUCKLE with CRYO and GAS BUBBLE to RIGHT EYE;  Surgeon: Stephannie LiJason Eldrige Pitkin, MD;  [facility];  Service: Ophthalmology;  Laterality: Right;  . TONSILLECTOMY      Family History  Problem Relation Age of Onset  . Diabetes Mother   . Diabetes Father    Social History:  reports that she has quit smoking. Her smoking use included Cigarettes. She has never used smokeless tobacco. She reports that she does not drink alcohol or use drugs.  Allergies:  Allergies  Allergen Reactions  . Vicodin  [Hydrocodone-Acetaminophen] Anaphylaxis and Swelling  . Bc Fast Pain [Aspirin-Salicylamide-Caffeine] Other (See Comments)    "messed up stomach and throat"    Medications Prior to Admission  Medication Sig Dispense Refill  . aspirin-acetaminophen-caffeine (EXCEDRIN MIGRAINE) 250-250-65 MG tablet Take 1 tablet by mouth every 6 (six) hours as needed for headache.    . calcium carbonate (TUMS EX) 750 MG chewable tablet Chew 1 tablet by mouth 2 (two) times daily as needed for heartburn.    Marland Kitchen. acetaminophen (TYLENOL) 500 MG tablet Take 500 mg by mouth every 4 (four) hours as needed for moderate pain or headache.     . ibuprofen (ADVIL,MOTRIN) 200 MG tablet Take 200 mg by mouth every 6 (six) hours as needed for headache or moderate pain.    Marland Kitchen. oxyCODONE-acetaminophen (ROXICET) 5-325 MG tablet Take 1 tablet by mouth every 4 (four) hours as needed. (Patient not taking: Reported on 05/28/2016) 10 tablet 0    Results for orders placed or performed during the hospital encounter of 05/31/16 (from the past 48 hour(s))  CBC     Status: None   Collection Time: 05/31/16 10:54 AM  Result Value Ref Range   WBC 5.0 4.0 - 10.5 K/uL   RBC 4.31 3.87 - 5.11 MIL/uL   Hemoglobin 13.0 12.0 - 15.0 g/dL   HCT 16.139.6 09.636.0 - 04.546.0 %   MCV 91.9 78.0 - 100.0 fL   MCH 30.2 26.0 - 34.0  pg   MCHC 32.8 30.0 - 36.0 g/dL   RDW 96.0 45.4 - 09.8 %   Platelets 246 150 - 400 K/uL   No results found.  Review of Systems  Eyes: Positive for blurred vision.  All other systems reviewed and are negative.   Blood pressure 120/64, pulse 65, temperature 98.4 F (36.9 C), temperature source Oral, resp. rate 18, height 5\' 6"  (1.676 m), weight 81.6 kg (180 lb), SpO2 98 %. Physical Exam  Vitals reviewed. Constitutional: She appears well-developed and well-nourished.  Eyes:       Assessment/Plan 1. Retina attached after repair with oil. Oil ready to come out: Plan PPV, removal of oil OD.   Donnel Saxon, MD 05/31/2016, 11:21  AM

## 2016-05-31 NOTE — Anesthesia Preprocedure Evaluation (Signed)
Anesthesia Evaluation  Patient identified by MRN, date of birth, ID band Patient awake    Reviewed: Allergy & Precautions, NPO status , Patient's Chart, lab work & pertinent test results  History of Anesthesia Complications Negative for: history of anesthetic complications  Airway Mallampati: II  TM Distance: >3 FB Neck ROM: Full    Dental  (+) Lower Dentures, Dental Advisory Given   Pulmonary neg pulmonary ROS, former smoker,    Pulmonary exam normal breath sounds clear to auscultation       Cardiovascular Exercise Tolerance: Good (-) anginanegative cardio ROS Normal cardiovascular exam Rhythm:Regular Rate:Normal     Neuro/Psych  Headaches, Anxiety    GI/Hepatic Neg liver ROS, GERD  Medicated and Controlled,  Endo/Other  negative endocrine ROS  Renal/GU negative Renal ROS  negative genitourinary   Musculoskeletal  (+) Arthritis , Osteoarthritis,    Abdominal Normal abdominal exam  (+)   Peds negative pediatric ROS (+)  Hematology negative hematology ROS (+)   Anesthesia Other Findings   Reproductive/Obstetrics negative OB ROS                             Lab Results  Component Value Date   WBC 5.0 05/31/2016   HGB 13.0 05/31/2016   HCT 39.6 05/31/2016   MCV 91.9 05/31/2016   PLT 246 05/31/2016     Chemistry      Component Value Date/Time   NA 142 05/31/2016 1054   K 3.7 05/31/2016 1054   CL 108 05/31/2016 1054   CO2 25 05/31/2016 1054   BUN 10 05/31/2016 1054   CREATININE 0.66 05/31/2016 1054      Component Value Date/Time   CALCIUM 9.1 05/31/2016 1054     No results found for: INR, PROTIME  Anesthesia Physical Anesthesia Plan  ASA: II  Anesthesia Plan: General   Post-op Pain Management:    Induction:   Airway Management Planned: Oral ETT  Additional Equipment:   Intra-op Plan:   Post-operative Plan: Extubation in OR  Informed Consent: I have reviewed  the patients History and Physical, chart, labs and discussed the procedure including the risks, benefits and alternatives for the proposed anesthesia with the patient or authorized representative who has indicated his/her understanding and acceptance.   Dental advisory given  Plan Discussed with: CRNA, Anesthesiologist and Surgeon  Anesthesia Plan Comments:         Anesthesia Quick Evaluation

## 2016-05-31 NOTE — Anesthesia Procedure Notes (Signed)
Procedure Name: Intubation Date/Time: 05/31/2016 12:02 PM Performed by: Geraldo DockerSOLHEIM, Bryden Darden SALOMON Pre-anesthesia Checklist: Patient identified, Patient being monitored, Timeout performed, Emergency Drugs available and Suction available Patient Re-evaluated:Patient Re-evaluated prior to inductionOxygen Delivery Method: Circle System Utilized Preoxygenation: Pre-oxygenation with 100% oxygen Intubation Type: IV induction Ventilation: Mask ventilation without difficulty Laryngoscope Size: Miller and 4 Grade View: Grade I Tube type: Oral Tube size: 7.5 mm Number of attempts: 1 Airway Equipment and Method: Stylet Placement Confirmation: ETT inserted through vocal cords under direct vision,  positive ETCO2 and breath sounds checked- equal and bilateral Secured at: 21 cm Tube secured with: Tape Dental Injury: Teeth and Oropharynx as per pre-operative assessment

## 2016-05-31 NOTE — Transfer of Care (Signed)
Immediate Anesthesia Transfer of Care Note  Patient: Tara PlattNancy V Frisch  Procedure(s) Performed: Procedure(s) with comments: PARS PLANA VITRECTOMY WITH 25 GAUGE (Right) - Vitrectomy , AC washout, removal of silicone oil right eye  Patient Location: PACU  Anesthesia Type:General  Level of Consciousness: awake, alert , oriented and patient cooperative  Airway & Oxygen Therapy: Patient Spontanous Breathing and Patient connected to nasal cannula oxygen  Post-op Assessment: Report given to RN, Post -op Vital signs reviewed and stable and Patient moving all extremities X 4  Post vital signs: Reviewed and stable  Last Vitals:  Vitals:   05/31/16 0959  BP: 120/64  Pulse: 65  Resp: 18  Temp: 36.9 C    Last Pain:  Vitals:   05/31/16 0959  TempSrc: Oral      Patients Stated Pain Goal: 0 (05/31/16 1030)  Complications: No apparent anesthesia complications

## 2016-05-31 NOTE — Op Note (Signed)
[name], [name] NO.:  [id number]  MEDICAL RECORD NO.:  [id number]  LOCATION:                                 FACILITY:  PHYSICIAN:  Corliss Parish, MD    DATE OF BIRTH:  08/28/1955  DATE OF PROCEDURE:  05/31/2016 DATE OF DISCHARGE:                              OPERATIVE REPORT   SURGEON:  Corliss Parish, MD.  ANESTHESIA:  General anesthesia coupled with retrobulbar block on the right eye.  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  PROCEDURE:  Pars plana vitrectomy with removal of silicone oil in the right eye and the anterior chamber washout of silicone oil.  COMPLICATIONS:  None.  FINDINGS:  There was attached retina and silicone oil present.  PROCEDURE DESCRIPTION:  The patient was identified in the preop holding area.  She was then taken to the operating room, where she was sedated and placed under general anesthesia.  When she was stabilized by the anesthesia team, the right eye was anesthetized using a retrobulbar block consisting of 1:1 mixture of 0.75% bupivacaine and 1% lidocaine. The retrobulbar needle was removed.  Next, the right eye was prepped and draped in usual sterile fashion for ocular surgery including trimming of lashes.  A Lieberman speculum was placed in the right upper and lower eyelids for exposure.  At that point in time, a 25-gauge trocar was used to introduce transconjunctival cannulas in the inferotemporal, superotemporal, supranasal quadrants. The infusion cannula was attached to the inferior temporal infusion cannula and confirmed to be in the vitreous cavity prior to its use.  A fourth cannula was introduced using a 25-gauge trocar into the anterior chamber through the limbus of the cornea.  This was done carefully and did  not involve any contact with the lens or cornea.  The trocar was removed.  A soft tip extrusion cannula was then used to gently aspirate the silicone oil bubble from the anterior chamber without difficulty. The soft tip extrusion cannula was then removed.  Next, attention was directed towards the vitreous cavity.  A vitrector was used to perform a limited vitrectomy just to clear any residual vitreous from the orifice of the cannula.  There was no vitreous encounter.  The vitrector was removed.  A viscous fluid injector and injection kit was used to gently aspirate the silicone oil from the vitreous cavity while the eye was infused with saline.  This resulted in a removal of the silicone oil.  After this was completed, a soft tip extrusion cannula was placed in the eye and the viscous and fluid injector was removed.  The eye underwent an air-fluid exchange as the fluid was gently aspirated using the soft tip.  The soft tip was also used to scum the meniscus of air-fluid exchange, allowing for removal of any micro bubbles of oil.  A complete air-fluid exchange was performed.  Next, the eye was gently reinflated with saline at a low pressure. Saline completely filled the entire volume of the vitreous cavity.  The soft tip was removed.  The eye was inspected.  The retina was attached on the buckle and showed no signs of instability.  Therefore, the cannulas removed from the sclerotomies.  Sclerotomies were self-sealing. This included the removal of the anterior cannula that was placed in the anterior chamber.  This was also self-sealing.  The eye was then treated with subconjunctival injections of 15 mg Ancef to 1 mg of dexamethasone. Eye was treated topically with 1 drop of 1% atropine TobraDex ointment. Speculum was removed.  The eyes were cleaned and closed and then patched and shielded.  The patient was then taken to recovery in excellent condition, having tolerated the procedure  well.     Corliss Parish, MD   ______________________________ Corliss Parish, MD    JBS/MEDQ  D:  05/31/2016  T:  05/31/2016  Job:  [id number]

## 2016-05-31 NOTE — Brief Op Note (Signed)
05/31/2016  1:07 PM  PATIENT:  Tara PlattNancy V Molina  61 y.o. female  PRE-OPERATIVE DIAGNOSIS:  Retained silicone oil T85.398D  POST-OPERATIVE DIAGNOSIS:  * No post-op diagnosis entered *  PROCEDURE:  Procedure(s) with comments: PARS PLANA VITRECTOMY WITH 25 GAUGE (Right) - Vitrectomy , AC washout, removal of silicone oil right eye  SURGEON:  Surgeon(s) and Role:    * Stephannie LiJason Ashira Kirsten, MD - Primary  PHYSICIAN ASSISTANT:   ASSISTANTS: none   ANESTHESIA:   general  EBL:  Total I/O In: 400 [I.V.:400] Out: -   BLOOD ADMINISTERED:none  DRAINS: none   LOCAL MEDICATIONS USED:  BUPIVICAINE   SPECIMEN:  No Specimen  DISPOSITION OF SPECIMEN:  N/A  COUNTS:  YES  TOURNIQUET:  * No tourniquets in log *  DICTATION: .Other Dictation: Dictation Number [id number]  PLAN OF CARE: Discharge to home after PACU  PATIENT DISPOSITION:  PACU - hemodynamically stable.   Delay start of Pharmacological VTE agent (>24hrs) due to surgical blood loss or risk of bleeding: not applicable

## 2016-06-01 ENCOUNTER — Encounter (HOSPITAL_COMMUNITY): Payer: Self-pay | Admitting: Ophthalmology

## 2016-06-07 NOTE — Anesthesia Postprocedure Evaluation (Addendum)
Anesthesia Post Note  Patient: [name]  Procedure(s) Performed: Procedure(s) (LRB): PARS PLANA VITRECTOMY WITH 25 GAUGE (Right)  Patient location during evaluation: PACU Anesthesia Type: General Level of consciousness: awake and alert Pain management: pain level controlled Vital Signs Assessment: post-procedure vital signs reviewed and stable Respiratory status: spontaneous breathing, nonlabored ventilation, respiratory function stable and patient connected to nasal cannula oxygen Cardiovascular status: blood pressure returned to baseline and stable Postop Assessment: no signs of nausea or vomiting Anesthetic complications: no       Last Vitals:  Vitals:   05/31/16 1330 05/31/16 1344  BP: 105/71 119/70  Pulse: 65 61  Resp: 12 16  Temp: 36.5 C     Last Pain:  Vitals:   05/31/16 0959  TempSrc: Oral                 Kady Toothaker

## 2016-09-20 NOTE — Addendum Note (Signed)
Addendum  created 09/20/16 1117 by Liron Eissler, MD   Sign clinical note    

## 2018-02-25 ENCOUNTER — Other Ambulatory Visit: Payer: Self-pay

## 2018-02-25 ENCOUNTER — Emergency Department (HOSPITAL_COMMUNITY)
Admission: EM | Admit: 2018-02-25 | Discharge: 2018-02-25 | Disposition: A | Discharge status: Home / Self Care | Payer: Self-pay | Attending: Emergency Medicine | Admitting: Emergency Medicine

## 2018-02-25 ENCOUNTER — Emergency Department (HOSPITAL_COMMUNITY): Payer: Self-pay

## 2018-02-25 ENCOUNTER — Encounter (HOSPITAL_COMMUNITY): Payer: Self-pay | Admitting: Emergency Medicine

## 2018-02-25 DIAGNOSIS — M545 Low back pain, unspecified: Secondary | ICD-10-CM

## 2018-02-25 DIAGNOSIS — Y929 Unspecified place or not applicable: Secondary | ICD-10-CM | POA: Insufficient documentation

## 2018-02-25 DIAGNOSIS — Z87891 Personal history of nicotine dependence: Secondary | ICD-10-CM | POA: Insufficient documentation

## 2018-02-25 DIAGNOSIS — W19XXXA Unspecified fall, initial encounter: Secondary | ICD-10-CM

## 2018-02-25 DIAGNOSIS — W172XXA Fall into hole, initial encounter: Secondary | ICD-10-CM | POA: Insufficient documentation

## 2018-02-25 DIAGNOSIS — M25571 Pain in right ankle and joints of right foot: Secondary | ICD-10-CM | POA: Insufficient documentation

## 2018-02-25 DIAGNOSIS — Y9301 Activity, walking, marching and hiking: Secondary | ICD-10-CM | POA: Insufficient documentation

## 2018-02-25 DIAGNOSIS — Y999 Unspecified external cause status: Secondary | ICD-10-CM | POA: Insufficient documentation

## 2018-02-25 MED ORDER — METHOCARBAMOL 500 MG PO TABS: 500.0000 mg | ORAL_TABLET | Freq: Two times a day (BID) | ORAL | 0 refills | Status: DC

## 2018-02-25 NOTE — ED Provider Notes (Signed)
[facility]-EMERGENCY DEPT Provider Note   CSN: [id number] Arrival date & time: 02/25/18  1346     History   Chief Complaint Chief Complaint  Patient presents with  . Back Pain  . Ankle Pain    HPI [name] is a 62 y.o. female with a past medical history significant for anxiety, headaches, retinal detachment who presents for evaluation after mechanical fall.  She states she was walking in the dark when her right foot went into a pothole.  Patient states she twisted her back when she fell into the hole.  Incident occurred approximately 1 week ago.  No loss of consciousness or head injury.  Has had intermittent low back pain and consistent right ankle pain. Patient is ambulatory, however has a limp.  Able to bear weight, however is painful to right ankle.  Rates pain a 5/10. Denies radiation of pain. Describes her pain as aching to her ankle and a "cramping, tightening feeling to my back." Denies fever, chills, neck pain, hip pain, numbness/tinging in extremities, weakness. Has taken excedrine for her pain with mild relief of symptoms. Denies hx malignancy, hx IVDU, saddle paresthesias, bowel or bladder incontinence.   History obtained from patient. No interpretor was used.    HPI  Past Medical History:  Diagnosis Date  . Anxiety   . Chest pain    5-6 years ago, EKG done and everything okay per pt report  . GERD (gastroesophageal reflux disease)   . Headache    migraines  . Thyroid disorder    pt unsure what was wrong, but it was a long time ago    There are no active problems to display for this patient.   Past Surgical History:  Procedure Laterality Date  . ABDOMINAL HYSTERECTOMY    . GAS/FLUID EXCHANGE Right 01/05/2016   Procedure: GAS/FLUID EXCHANGE;  Surgeon: Stephannie Li, MD;  Location: [facility] OR;  Service: Ophthalmology;  Laterality: Right;  . INJECTION OF SILICONE OIL Right 02/19/2016   Procedure: INJECTION OF SILICONE OIL IN RIGHT EYE;   Surgeon: Stephannie Li, MD;  Location: [facility] OR;  Service: Ophthalmology;  Laterality: Right;  . LASER PHOTO ABLATION Right 01/05/2016   Procedure: LASER PHOTO ABLATION;  Surgeon: Stephannie Li, MD;  Location: [facility] OR;  Service: Ophthalmology;  Laterality: Right;  . LASER PHOTO ABLATION Right 02/19/2016   Procedure: LASER PHOTO ABLATION OF RIGHT EYE;  Surgeon: Stephannie Li, MD;  Location: [facility] OR;  Service: Ophthalmology;  Laterality: Right;  . PARS PLANA VITRECTOMY Right 01/05/2016   Procedure: PARS PLANA VITRECTOMY WITH 25 GAUGE;  Surgeon: Stephannie Li, MD;  Location: [facility] OR;  Service: Ophthalmology;  Laterality: Right;  . PARS PLANA VITRECTOMY Right 05/31/2016   Procedure: PARS PLANA VITRECTOMY WITH 25 GAUGE;  Surgeon: Stephannie Li, MD;  Location: [facility] OR;  Service: Ophthalmology;  Laterality: Right;  Vitrectomy , AC washout, removal of silicone oil right eye  . SCLERAL BUCKLE Right 12/18/2015   Procedure: SCLERAL BUCKLE with CRYO and GAS BUBBLE to RIGHT EYE;  Surgeon: Stephannie Li, MD;  Location: [facility] OR;  Service: Ophthalmology;  Laterality: Right;  . TONSILLECTOMY       OB History   None      Home Medications    Prior to Admission medications   Medication Sig Start Date End Date Taking? Authorizing Provider  acetaminophen (TYLENOL) 500 MG tablet Take 500 mg by mouth every 4 (four) hours as needed for moderate pain or headache.     [provider]  aspirin-acetaminophen-caffeine (EXCEDRIN MIGRAINE) 250-250-65 MG tablet Take 1 tablet by mouth every 6 (six) hours as needed for headache.    [provider]  calcium carbonate (TUMS EX) 750 MG chewable tablet Chew 1 tablet by mouth 2 (two) times daily as needed for heartburn.    [provider]  ibuprofen (ADVIL,MOTRIN) 200 MG tablet Take 200 mg by mouth every 6 (six) hours as needed for headache or moderate pain.    [provider]  methocarbamol (ROBAXIN) 500 MG tablet Take 1 tablet (500 mg total) by mouth 2 (two)  times daily. 02/25/18   Rorey Hodges A, PA-C  oxyCODONE-acetaminophen (ROXICET) 5-325 MG tablet Take 1 tablet by mouth every 4 (four) hours as needed. Patient not taking: Reported on 05/28/2016 02/19/16   Stephannie Li, MD    Family History Family History  Problem Relation Age of Onset  . Diabetes Mother   . Diabetes Father     Social History Social History   Tobacco Use  . Smoking status: Former Smoker    Types: Cigarettes  . Smokeless tobacco: Never Used  . Tobacco comment: in 2007  Substance Use Topics  . Alcohol use: No  . Drug use: No     Allergies   Vicodin [hydrocodone-acetaminophen] and Bc fast pain [aspirin-salicylamide-caffeine]   Review of Systems Review of Systems  Constitutional: Negative.   Respiratory: Negative.   Cardiovascular: Negative.   Gastrointestinal: Negative.   Genitourinary: Negative.   Musculoskeletal: Positive for back pain. Negative for arthralgias, gait problem, joint swelling, myalgias, neck pain and neck stiffness.       Right ankle pain  Skin: Negative.   Neurological: Negative.   All other systems reviewed and are negative.    Physical Exam Updated Vital Signs BP (!) 165/73 (BP Location: Left Arm)   Pulse 67   Temp 97.6 F (36.4 C) (Oral)   Resp 18   Wt 77.1 kg   SpO2 99%   BMI 27.44 kg/m   Physical Exam  Physical Exam  Constitutional: Pt appears well-developed and well-nourished. No distress.  HENT:  Head: Normocephalic and atraumatic.  Mouth/Throat: Oropharynx is clear and moist. No oropharyngeal exudate.  Eyes: Conjunctivae are normal.  Neck: Normal range of motion. Neck supple.  Full ROM without pain  Cardiovascular: Normal rate, regular rhythm and intact distal pulses.   Pulmonary/Chest: Effort normal and breath sounds normal. No respiratory distress. Pt has no wheezes.  Abdominal: Soft. Pt exhibits no distension. There is no tenderness, rebound or guarding. No abd bruit or pulsatile mass Musculoskeletal:    Full range of motion of the T-spine and L-spine with flexion, hyperextension, and lateral flexion. No midline tenderness or stepoffs. No tenderness to palpation of the spinous processes of the T-spine or L-spine. Mild tenderness to palpation of the paraspinous muscles of the L-spine. Negative straight leg raise. Tenderness to palpation to lateral malleolus on right ankle. No obvious deformity. Full ROM with plantar flexion, dorsiflexion as well as inversion and eversion. Mild edema to right lateral malleolus. No tenderness to navicular. Bilateral lower extremity compartments are soft. No tenderness to bilateral tib/fib or knee.  Full range of motion bilateral lower extremity.  No tenderness to lateral or medial joint line on knee.  Negative varus valgus stress and bilateral knees.  Negative anterior drawer on bilateral knees. Lymphadenopathy:    Pt has no cervical adenopathy.  Neurological: Pt is alert. Pt has normal reflexes.  Reflex Scores:      Bicep reflexes are  2+ on the right side and 2+ on the left side.      Brachioradialis reflexes are 2+ on the right side and 2+ on the left side.      Patellar reflexes are 2+ on the right side and 2+ on the left side.      Achilles reflexes are 2+ on the right side and 2+ on the left side. Speech is clear and goal oriented, follows commands Normal 5/5 strength in upper and lower extremities bilaterally including dorsiflexion and plantar flexion, strong and equal grip strength Sensation normal to light and sharp touch Moves extremities without ataxia, coordination intact Normal gait Normal balance Skin: Skin is warm and dry. No rash noted or lesions noted. Pt is not diaphoretic. No erythema, ecchymosis or warmth.  Psychiatric: Pt has a normal mood and affect. Behavior is normal.  Nursing note and vitals reviewed. ED Treatments / Results  Labs (all labs ordered are listed, but only abnormal results are displayed) Labs Reviewed - No data to  display  EKG None  Radiology Dg Lumbar Spine Complete  Result Date: 02/25/2018 CLINICAL DATA:  Low back pain after injury last week. EXAM: LUMBAR SPINE - COMPLETE 4+ VIEW COMPARISON:  None. FINDINGS: No fracture or spondylolisthesis is noted. Moderate degenerative disc disease is noted at L2-3 and L3-4. IMPRESSION: Moderate multilevel degenerative disc disease. No acute abnormality seen in the lumbar spine. Electronically Signed   By: Lupita Raider, M.D.   On: 02/25/2018 15:25   Dg Ankle Complete Right  Result Date: 02/25/2018 CLINICAL DATA:  Right ankle pain after injury last week. EXAM: RIGHT ANKLE - COMPLETE 3+ VIEW COMPARISON:  None. FINDINGS: There is no evidence of fracture, dislocation, or joint effusion. There is no evidence of arthropathy or other focal bone abnormality. Soft tissue swelling is seen over the lateral malleolus. IMPRESSION: No fracture or dislocation is noted. Soft tissue swelling is seen over lateral malleolus. Electronically Signed   By: Lupita Raider, M.D.   On: 02/25/2018 15:23    Procedures Procedures (including critical care time)  Medications Ordered in ED Medications - No data to display   Initial Impression / Assessment and Plan / ED Course  I have reviewed the triage vital signs and the nursing notes.  Pertinent labs & imaging results that were available during my care of the patient were reviewed by me and considered in my medical decision making (see chart for details).  62 year old female who appears otherwise well presents for evaluation mechanical fall.  Afebrile, nonseptic, non-ill-appearing.  Mild Tenderness to palpation to lumbar spine and right lateral malleolus.  Able to ambulate, however has pain with ambulation.  Normal neurologic exam without neuro deficits.  Neurovascularly intact.  Normal musculoskeletal exam.  Lower extremity compartments are soft.  No bony tenderness to tib-fib or knee.  No head trauma or loss of consciousness.  Will  obtain plain film lumbar spine and right ankle and reevaluate.  Patient does not want anything for pain at this time.  1510: Plain film lumbar spine and right ankle without fracture or dislocation.  Exam most consistent with musculoskeletal spasms in the back and sprain of right ankle.  Low suspicion for emergent pathology causing patient's back pain and right ankle pain.  No red flag symptoms.  Discussed with patient to ollow-up with orthopedic surgery if she has continued pain.  Discussed symptomatic management as well as rice protocol.  Patient is hemodynamically stable.  Discussed return precautions.  Patient voices understanding and  is agreeable for follow-up.    Final Clinical Impressions(s) / ED Diagnoses   Final diagnoses:  Fall, initial encounter  Acute right ankle pain  Lumbar back pain    ED Discharge Orders         Ordered    methocarbamol (ROBAXIN) 500 MG tablet  2 times daily     02/25/18 1543           Iylah Dworkin A, PA-C 02/25/18 1547    Geoffery Lyons, MD 02/25/18 1547

## 2018-02-25 NOTE — ED Triage Notes (Signed)
Pt reports low back pain and r/ankle pain one week after tripping over a pot hole. Pt is ambulatory, with a limp.Able to bear weight. Denies head injury

## 2018-02-25 NOTE — Discharge Instructions (Addendum)
You were evaluated today after a fall.  Your x-ray of your back in your right ankle did not show any evidence of fracture or dislocation.  I think you are possibly having muscle spasms in your back because of the twisting with your fall.  You most likely have a sprain of the ligaments in your right ankle.  We have placed an Ace wrap to your right ankle.  I have prescribed you ibuprofen and Robaxin for your pain.  Robaxin is a muscle relaxer.  Please do not drive or operate heavy machinery while taking this medicine.  May obtain ibuprofen over-the-counter at your local drugstore.  You may take up to 600 mg every 8 hours for pain.  If you continue to have pain into the middle of next week please follow-up with your primary care provider or orthopedics for reevaluation.  Return to the ED for any new or worsening symptoms

## 2019-07-27 IMAGING — CR DG LUMBAR SPINE COMPLETE 4+V
5 series · 5 of 5 positions shown · non-contrast
Comparison: None.

CLINICAL DATA: Low back pain after injury last week.

EXAM:
LUMBAR SPINE - COMPLETE 4+ VIEW

[t lumbar spine ap]
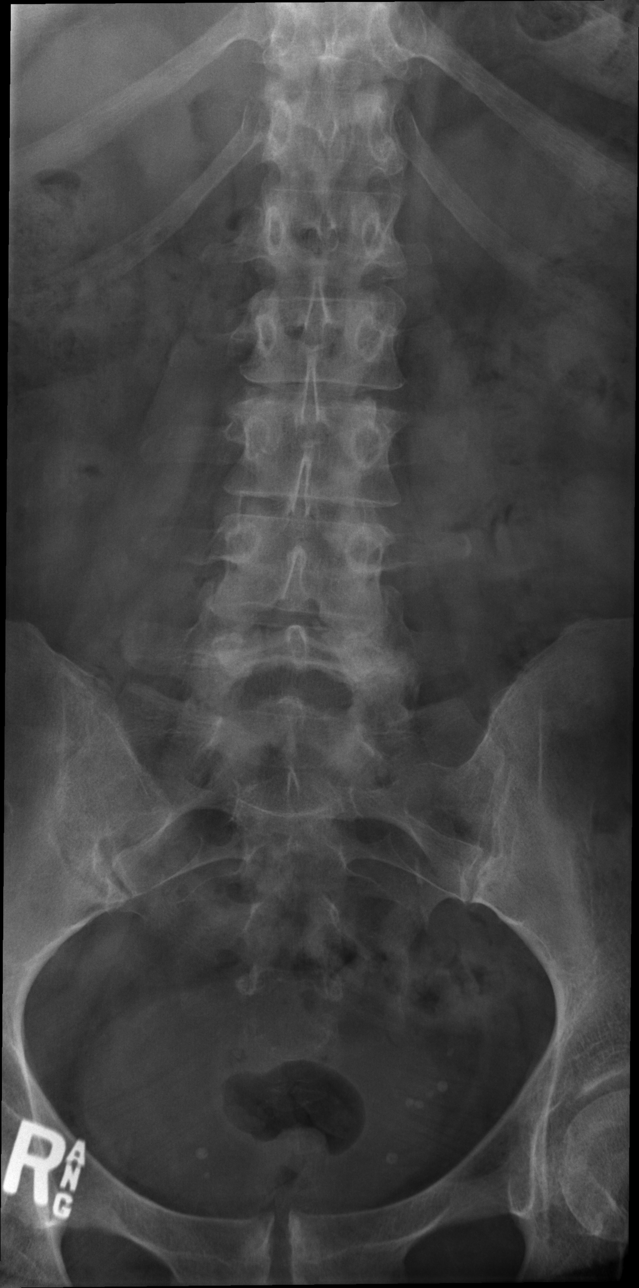

[t lumbar spine obl (1 of 2)]
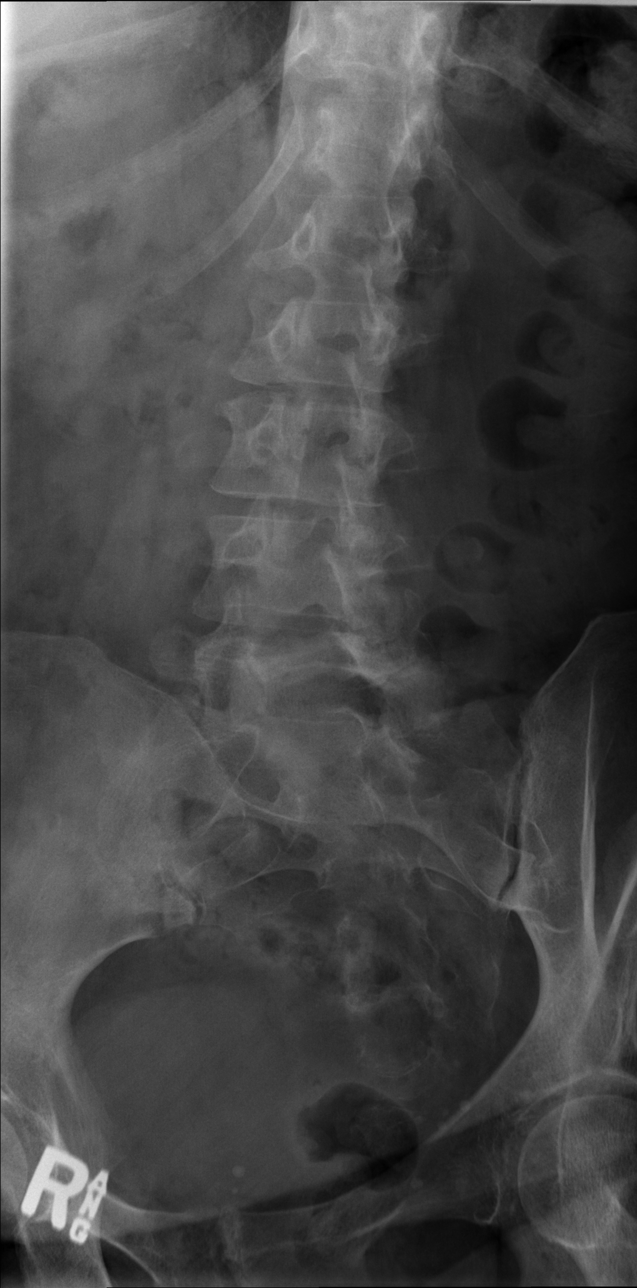

[t lumbar spine obl (2 of 2)]
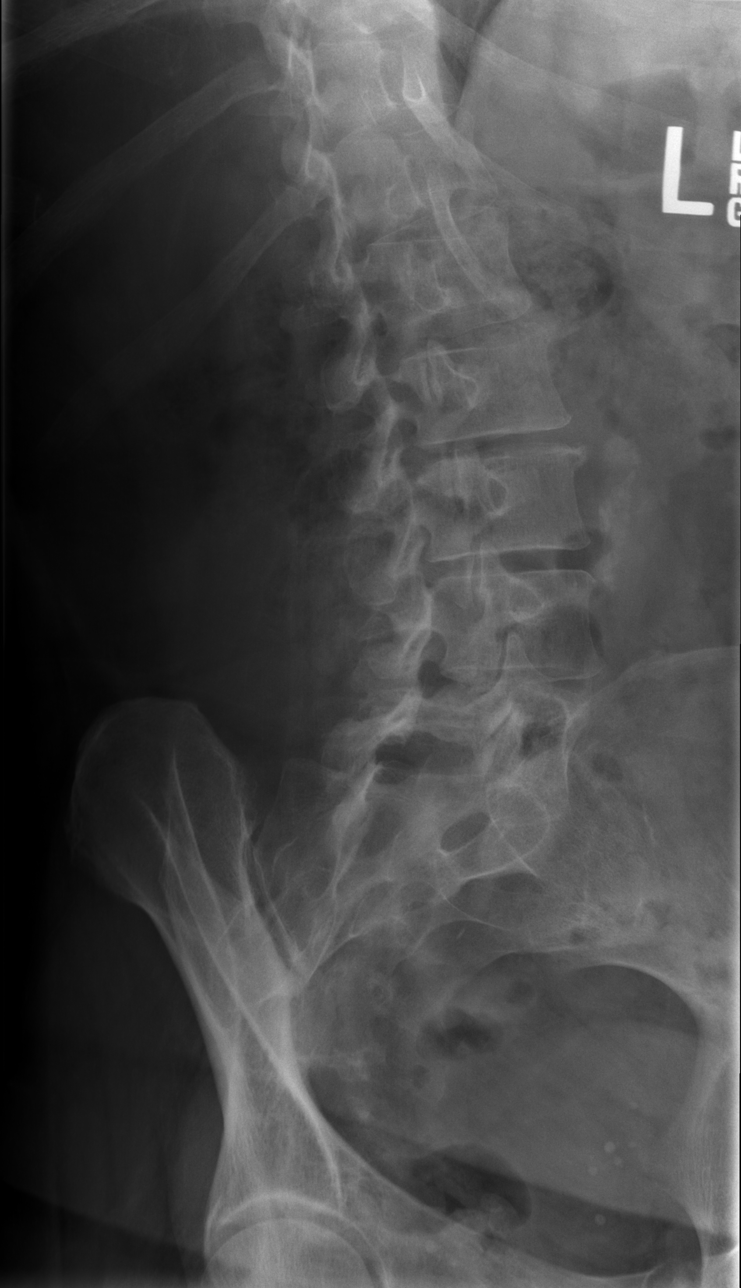

[t lumbar spine lat]
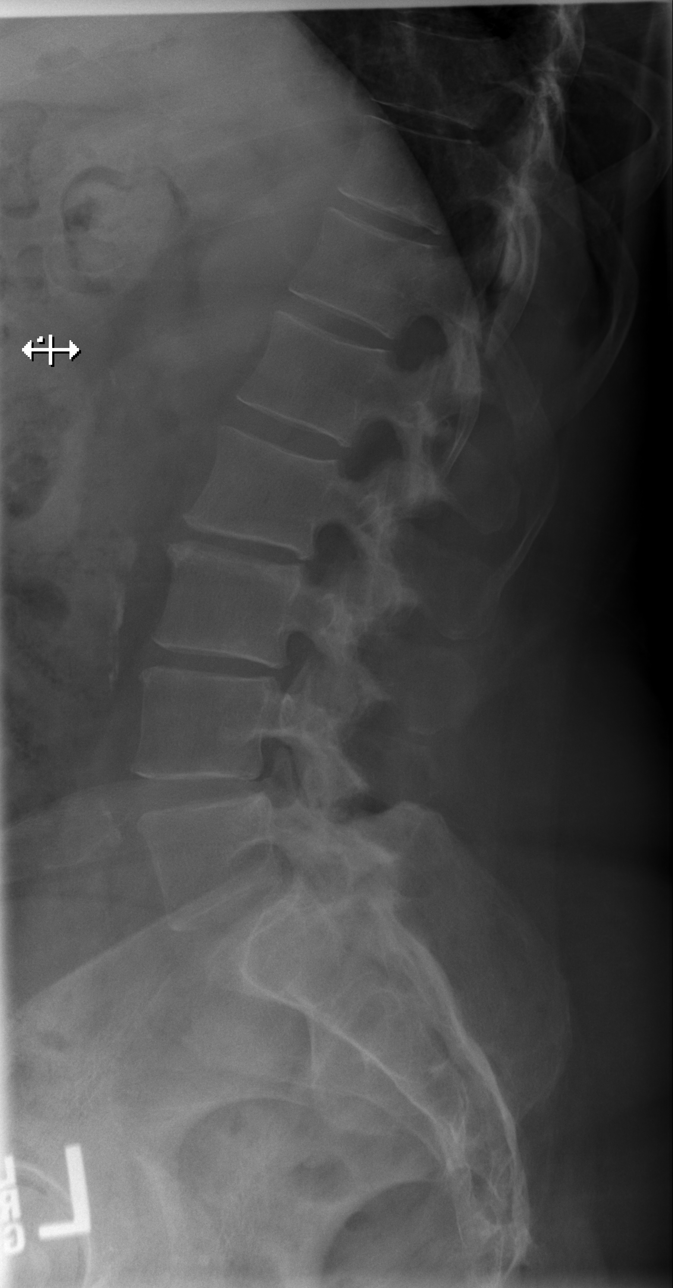

[t lumbar l-5 s-1 spot]
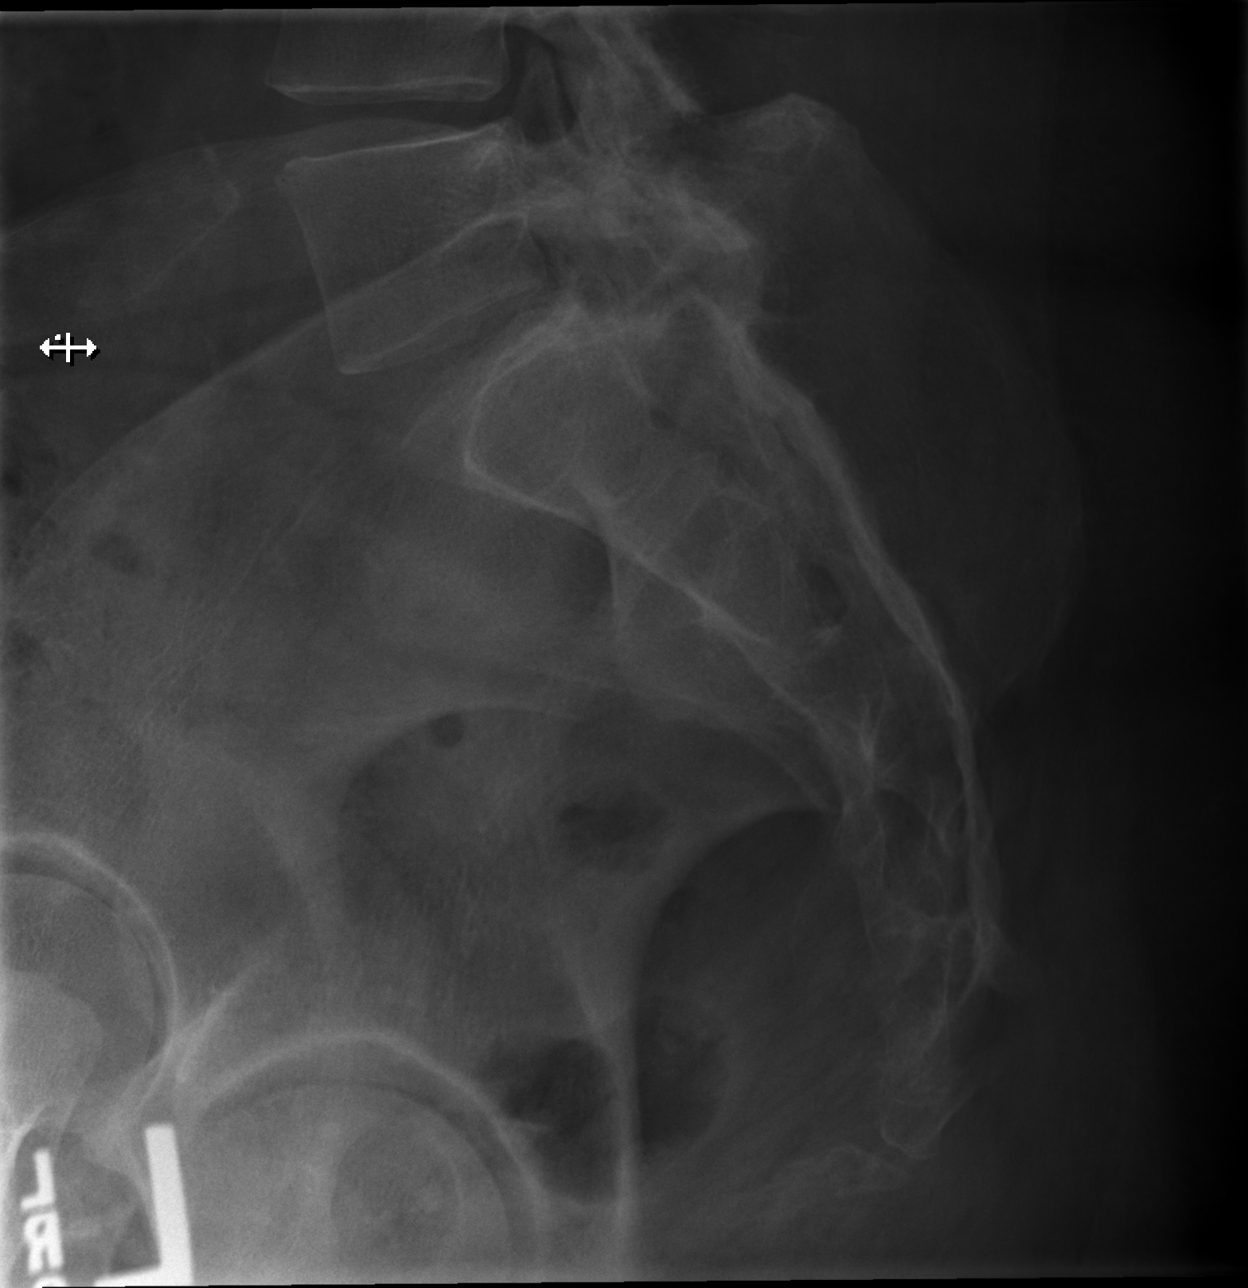

[5 of 5 positions shown; findings below may reference images not displayed]

FINDINGS: No fracture or spondylolisthesis is noted. Moderate degenerative
disc disease is noted at L2-3 and L3-4.
IMPRESSION: Moderate multilevel degenerative disc disease. No acute abnormality
seen in the lumbar spine.

## 2020-10-13 DIAGNOSIS — H35373 Puckering of macula, bilateral: Secondary | ICD-10-CM | POA: Diagnosis not present

## 2020-10-13 DIAGNOSIS — H59811 Chorioretinal scars after surgery for detachment, right eye: Secondary | ICD-10-CM | POA: Diagnosis not present

## 2020-10-13 DIAGNOSIS — T8521XA Breakdown (mechanical) of intraocular lens, initial encounter: Secondary | ICD-10-CM | POA: Diagnosis not present

## 2020-10-13 DIAGNOSIS — H2701 Aphakia, right eye: Secondary | ICD-10-CM | POA: Diagnosis not present

## 2020-11-26 DIAGNOSIS — T8522XA Displacement of intraocular lens, initial encounter: Secondary | ICD-10-CM | POA: Diagnosis not present

## 2020-11-26 DIAGNOSIS — H547 Unspecified visual loss: Secondary | ICD-10-CM | POA: Diagnosis not present

## 2020-12-08 DIAGNOSIS — H35373 Puckering of macula, bilateral: Secondary | ICD-10-CM | POA: Diagnosis not present

## 2020-12-29 DIAGNOSIS — H35371 Puckering of macula, right eye: Secondary | ICD-10-CM | POA: Diagnosis not present

## 2020-12-29 DIAGNOSIS — H43812 Vitreous degeneration, left eye: Secondary | ICD-10-CM | POA: Diagnosis not present

## 2021-03-02 DIAGNOSIS — H59811 Chorioretinal scars after surgery for detachment, right eye: Secondary | ICD-10-CM | POA: Diagnosis not present

## 2021-03-02 DIAGNOSIS — H43812 Vitreous degeneration, left eye: Secondary | ICD-10-CM | POA: Diagnosis not present

## 2021-03-02 DIAGNOSIS — H43822 Vitreomacular adhesion, left eye: Secondary | ICD-10-CM | POA: Diagnosis not present

## 2021-03-02 DIAGNOSIS — H35373 Puckering of macula, bilateral: Secondary | ICD-10-CM | POA: Diagnosis not present

## 2021-07-01 DIAGNOSIS — H43812 Vitreous degeneration, left eye: Secondary | ICD-10-CM | POA: Diagnosis not present

## 2021-07-01 DIAGNOSIS — H3581 Retinal edema: Secondary | ICD-10-CM | POA: Diagnosis not present

## 2021-07-01 DIAGNOSIS — H35373 Puckering of macula, bilateral: Secondary | ICD-10-CM | POA: Diagnosis not present

## 2021-07-01 DIAGNOSIS — H59811 Chorioretinal scars after surgery for detachment, right eye: Secondary | ICD-10-CM | POA: Diagnosis not present

## 2021-07-01 DIAGNOSIS — H2512 Age-related nuclear cataract, left eye: Secondary | ICD-10-CM | POA: Diagnosis not present

## 2021-10-28 DIAGNOSIS — H524 Presbyopia: Secondary | ICD-10-CM | POA: Diagnosis not present

## 2021-10-28 DIAGNOSIS — H5203 Hypermetropia, bilateral: Secondary | ICD-10-CM | POA: Diagnosis not present

## 2021-10-28 DIAGNOSIS — H52209 Unspecified astigmatism, unspecified eye: Secondary | ICD-10-CM | POA: Diagnosis not present

## 2022-01-06 DIAGNOSIS — H35373 Puckering of macula, bilateral: Secondary | ICD-10-CM | POA: Diagnosis not present

## 2022-01-06 DIAGNOSIS — H43812 Vitreous degeneration, left eye: Secondary | ICD-10-CM | POA: Diagnosis not present

## 2022-01-06 DIAGNOSIS — H59811 Chorioretinal scars after surgery for detachment, right eye: Secondary | ICD-10-CM | POA: Diagnosis not present

## 2022-01-06 DIAGNOSIS — H43822 Vitreomacular adhesion, left eye: Secondary | ICD-10-CM | POA: Diagnosis not present

## 2022-06-23 DIAGNOSIS — H43822 Vitreomacular adhesion, left eye: Secondary | ICD-10-CM | POA: Diagnosis not present

## 2022-06-23 DIAGNOSIS — H35373 Puckering of macula, bilateral: Secondary | ICD-10-CM | POA: Diagnosis not present

## 2022-06-23 DIAGNOSIS — H04123 Dry eye syndrome of bilateral lacrimal glands: Secondary | ICD-10-CM | POA: Diagnosis not present

## 2022-06-23 DIAGNOSIS — H35432 Paving stone degeneration of retina, left eye: Secondary | ICD-10-CM | POA: Diagnosis not present

## 2022-06-23 DIAGNOSIS — H2513 Age-related nuclear cataract, bilateral: Secondary | ICD-10-CM | POA: Diagnosis not present

## 2022-06-23 DIAGNOSIS — H43812 Vitreous degeneration, left eye: Secondary | ICD-10-CM | POA: Diagnosis not present

## 2022-06-23 DIAGNOSIS — H59811 Chorioretinal scars after surgery for detachment, right eye: Secondary | ICD-10-CM | POA: Diagnosis not present

## 2022-08-23 ENCOUNTER — Ambulatory Visit (INDEPENDENT_AMBULATORY_CARE_PROVIDER_SITE_OTHER): Payer: Medicare HMO | Admitting: Student

## 2022-08-23 ENCOUNTER — Encounter: Payer: Self-pay | Admitting: Student

## 2022-08-23 VITALS — BP 120/86 | HR 81 | Ht 66.0 in | Wt 232.4 lb

## 2022-08-23 DIAGNOSIS — R1319 Other dysphagia: Secondary | ICD-10-CM | POA: Diagnosis not present

## 2022-08-23 DIAGNOSIS — R600 Localized edema: Secondary | ICD-10-CM | POA: Diagnosis not present

## 2022-08-23 DIAGNOSIS — Z0000 Encounter for general adult medical examination without abnormal findings: Secondary | ICD-10-CM | POA: Diagnosis not present

## 2022-08-23 DIAGNOSIS — H9190 Unspecified hearing loss, unspecified ear: Secondary | ICD-10-CM | POA: Insufficient documentation

## 2022-08-23 DIAGNOSIS — H9193 Unspecified hearing loss, bilateral: Secondary | ICD-10-CM | POA: Diagnosis not present

## 2022-08-23 NOTE — Patient Instructions (Signed)
It was great to see you today!   Today we addressed: Leg swelling, You will call our office to schedule this ultrasound once insurance approves. Lab, I will give you a call once they result. I am sending in a referral to audiology   Your next appointment is June 10th at 3:15 PM  Please arrive 15 minutes before your appointment to ensure smooth check in process.    Please call the clinic at [phone] if your symptoms worsen or you have any concerns.  Thank you for allowing me to participate in your care, Dr. Glendale Chard [facility]

## 2022-08-23 NOTE — Assessment & Plan Note (Signed)
Clinical presentation and exam findings concerning for CHF or renal etiology.  With lack of medical history and labs will need to obtain BMP, urine microalbumin, A1c.  Will also order an echo to rule out CHF.  -Instructed patient to use compression stockings as needed to help control symptoms

## 2022-08-23 NOTE — Assessment & Plan Note (Signed)
Referral sent to audiology.

## 2022-08-23 NOTE — Progress Notes (Signed)
    SUBJECTIVE:   CHIEF COMPLAINT / HPI:   [name] is a 67 y.o. female  presenting to establish care as a new patient. She does not regularly go to the doctor and does not carry any medical diagnosis.   Intermittent Bilateral Leg swelling: Ongoing months to years.  Reports having continued swelling in ankles that sometimes goes up to her knees.  Denies orthopnea.  Sometimes gets short of breath with exertion but denies chest pain.  First-degree relative with congestive heart failure.   Vomiting: She reports getting food stuck in her throat ongoing for years.  Sometimes this requires her to vomit.  She denies aspiration events.  She is very reluctant to be seen by gastroenterology due to a poor experience by relative when getting an EGD.  Hearing deficit: Patient reports she has been unable to hear out of both ears but more on her right for several years most recently getting worse.  She has never seen an audiologist in the past.  PERTINENT  PMH / PSH: Reviewed and updated   OBJECTIVE:   BP 120/86   Pulse 81   Ht 5\' 6"  (1.676 m)   Wt 232 lb 6.4 oz (105.4 kg)   SpO2 95%   BMI 37.51 kg/m   Chronically ill-appearing, no acute distress, hard of hearing on exam  Cardio: Regular rate, regular rhythm, 3/6 systolic murmur  Pulm: Clear, no wheezing, no crackles. No increased work of breathing Abdominal: bowel sounds present, soft, non-tender, non-distended Extremities: +1 pitting edema bilateral LE  ASSESSMENT/PLAN:   Bilateral edema of lower extremity Clinical presentation and exam findings concerning for CHF or renal etiology.  With lack of medical history and labs will need to obtain BMP, urine microalbumin, A1c.  Will also order an echo to rule out CHF.  -Instructed patient to use compression stockings as needed to help control symptoms  Esophageal dysphagia Discussed with patient need for GI referral due to for aspiration, stricture, possible malignancy.  She is adamant that  she is not wanting to have an EGD at this time.  Will continue to discuss this at future visits.  HOH (hard of hearing) Referral sent to audiology   Health Maintenance:  - Hep C ordered this visit Colonoscopy DEXA Mammogram Shingrix Pneumonia Medicare annual wellness  Glendale Chard, DO [facility]

## 2022-08-23 NOTE — Assessment & Plan Note (Signed)
Discussed with patient need for GI referral due to for aspiration, stricture, possible malignancy.  She is adamant that she is not wanting to have an EGD at this time.  Will continue to discuss this at future visits.

## 2022-08-24 LAB — LIPID PANEL
Chol/HDL Ratio: 5.5 ratio — ABNORMAL HIGH (ref 0.0–4.4)
LDL Chol Calc (NIH): 199 mg/dL — ABNORMAL HIGH (ref 0–99)
Triglycerides: 105 mg/dL (ref 0–149)
VLDL Cholesterol Cal: 19 mg/dL (ref 5–40)

## 2022-08-24 LAB — BASIC METABOLIC PANEL
BUN/Creatinine Ratio: 17 (ref 12–28)
BUN: 17 mg/dL (ref 8–27)
Calcium: 9.9 mg/dL (ref 8.7–10.3)
Chloride: 101 mmol/L (ref 96–106)
Creatinine, Ser: 1.02 mg/dL — ABNORMAL HIGH (ref 0.57–1.00)
Potassium: 4 mmol/L (ref 3.5–5.2)
Sodium: 140 mmol/L (ref 134–144)
eGFR: 60 mL/min/{1.73_m2} (ref >59)

## 2022-08-24 LAB — MICROALBUMIN / CREATININE URINE RATIO: Microalbumin, Urine: <3 ug/mL

## 2022-08-24 LAB — HEMOGLOBIN A1C
Est. average glucose Bld gHb Est-mCnc: 114 mg/dL
Hgb A1c MFr Bld: 5.6 % (ref 4.8–5.6)

## 2022-08-25 ENCOUNTER — Telehealth: Payer: Self-pay | Admitting: Student

## 2022-08-25 LAB — BASIC METABOLIC PANEL
CO2: 24 mmol/L (ref 20–29)
Glucose: 86 mg/dL (ref 70–99)

## 2022-08-25 LAB — LIPID PANEL
Cholesterol, Total: 266 mg/dL — ABNORMAL HIGH (ref 100–199)
HDL: 48 mg/dL (ref >39)

## 2022-08-25 LAB — HEPATITIS C ANTIBODY: Hep C Virus Ab: NONREACTIVE

## 2022-08-25 LAB — MICROALBUMIN / CREATININE URINE RATIO: Creatinine, Urine: 22.7 mg/dL

## 2022-08-25 NOTE — Telephone Encounter (Signed)
Called daughter about lab results. Overall there is nothing too concerning. We discussed possibly starting a statin at the next visit due to high cholesterol. They are returning to the office on June 10th to discuss this medication and follow up on TTE.   Glendale Chard, DO [facility], PGY-1 08/25/22 8:41 AM

## 2022-08-26 ENCOUNTER — Ambulatory Visit: Payer: Medicare HMO | Attending: Audiology | Admitting: Audiology

## 2022-08-26 DIAGNOSIS — H903 Sensorineural hearing loss, bilateral: Secondary | ICD-10-CM

## 2022-08-26 NOTE — Procedures (Signed)
  Outpatient Audiology and [facility] [address] [place], [place]  [address] [phone]  AUDIOLOGICAL  EVALUATION  NAME: EARTHA SUMMERHILL     DOB:   [birth year]      MRN: [id number]                                                                                     DATE: 08/26/2022     REFERENT: Glendale Chard, DO STATUS: Outpatient DIAGNOSIS: Sensorineural hearing loss, bilateral    History: Aaliyahrose was seen for an audiological evaluation due to decreased hearing occurring for many years. Elizamarie was accompanied to the appointment by her daughter. Basilia denies otalgia, aural fullness, tinnitus, and dizziness. Johana reports decreased hearing in the right ear occurring over the past years. Ninfa subjectively reports better hearing sensitivity in the left ear.   Evaluation:  Otoscopy showed a clear view of the tympanic membranes, bilaterally Tympanometry results were consistent with normal middle ear pressure and normal tympanic membrane mobility (Type A), bilaterally.  Audiometric testing was completed using Conventional Audiometry techniques with insert earphones and TDH headphones. Test results are consistent with a mild to severe sensorineural hearing loss in the left ear and a severe to profound hearing loss in the right ear. A mixed hearing loss is noted at [phone] and sensorineural hearing loss is noted at 2000-4000 Hz. Asymmetry noted at [phone] worse in the right ear.  Speech Recognition Thresholds were obtained at 45  dB HL in the left ear. An SRT was attempted in the right ear however Lateka could not understand the words therefore a Speech Detection Threshold (SDT) was obtained at 90 dB HL.  Word Recognition Testing was completed at 70 dB HL and Cherri scored 96% in the left ear. Word Recognition testing was initially attempted at a louder volume however Shardaye reported she could not tolerate the volume.  During testing, Zerenity reported multiple times the volume in the  left ear was too much during word testing and when giving instructions for the test.     Results:  The test results were reviewed with Harriett Sine and her daughter. Today's results are consistent with a mild to severe sensorineural hearing loss in the left ear and a severe to profound hearing loss in the right ear. A mixed hearing loss is noted at [phone] and sensorineural hearing loss is noted at 2000-4000 Hz. Asymmetry noted at [phone] worse in the right ear.  Lessli will have hearing and communication difficulty in all listening environments. She will benefit from the use of good communication strategies and the use of a hearing aid for the left ear. A Referral to an ENT is recommended due to asymmetric hearing loss.   Recommendations: 1.   Referral to an ENT for asymmetric hearing loss 2.   Schedule an appointment with [facility] Audiology clinic for hyperacusis and a communication needs assessment to discuss amplification.    30 minutes spent testing and counseling on results.   If you have any questions please feel free to contact me at [phone].  Marton Redwood Audiologist, Au.D., CCC-A 08/26/2022  2:54 PM  Cc: Glendale Chard, DO

## 2022-09-07 ENCOUNTER — Ambulatory Visit (HOSPITAL_COMMUNITY)
Admission: RE | Admit: 2022-09-07 | Discharge: 2022-09-07 | Disposition: A | Discharge status: Home / Self Care | Payer: Medicare HMO | Source: Ambulatory Visit | Attending: Family Medicine | Admitting: Family Medicine

## 2022-09-07 DIAGNOSIS — I3481 Nonrheumatic mitral (valve) annulus calcification: Secondary | ICD-10-CM | POA: Insufficient documentation

## 2022-09-07 DIAGNOSIS — R079 Chest pain, unspecified: Secondary | ICD-10-CM | POA: Insufficient documentation

## 2022-09-07 DIAGNOSIS — R0600 Dyspnea, unspecified: Secondary | ICD-10-CM | POA: Diagnosis not present

## 2022-09-07 DIAGNOSIS — R600 Localized edema: Secondary | ICD-10-CM | POA: Diagnosis not present

## 2022-09-07 DIAGNOSIS — K219 Gastro-esophageal reflux disease without esophagitis: Secondary | ICD-10-CM | POA: Diagnosis not present

## 2022-09-07 LAB — ECHOCARDIOGRAM COMPLETE
Area-P 1/2: 2.99 cm2
Calc EF: 68 %
S' Lateral: 3.3 cm
Single Plane A2C EF: 70.3 %
Single Plane A4C EF: 67.8 %

## 2022-09-07 NOTE — Progress Notes (Signed)
Echocardiogram 2D Echocardiogram has been performed.  Augustine Radar 09/07/2022, 11:44 AM

## 2022-09-08 ENCOUNTER — Telehealth: Payer: Self-pay | Admitting: Student

## 2022-09-08 DIAGNOSIS — H90A22 Sensorineural hearing loss, unilateral, left ear, with restricted hearing on the contralateral side: Secondary | ICD-10-CM

## 2022-09-08 DIAGNOSIS — I270 Primary pulmonary hypertension: Secondary | ICD-10-CM

## 2022-09-08 DIAGNOSIS — H9191 Unspecified hearing loss, right ear: Secondary | ICD-10-CM

## 2022-09-08 NOTE — Telephone Encounter (Signed)
Called daughter to discuss results of echo. Overall it is normal but she does hav elevated RVSP concerning for pulmonary disease. Will send referral to pulmonology for PFT testing and further workup for pulmonary hypertension.   Also seen by audiologist and patient found to have asymmetric hearing loss. They are recommending referral to ENT for further workup. Referral placed.   Glendale Chard, DO [facility], PGY-1 09/08/22 8:59 AM

## 2022-09-17 ENCOUNTER — Ambulatory Visit: Payer: Medicare HMO | Admitting: Internal Medicine

## 2022-09-17 ENCOUNTER — Encounter: Payer: Self-pay | Admitting: Internal Medicine

## 2022-09-17 VITALS — BP 140/82 | HR 60 | Temp 97.8°F | Ht 65.0 in | Wt 233.4 lb

## 2022-09-17 DIAGNOSIS — I2729 Other secondary pulmonary hypertension: Secondary | ICD-10-CM | POA: Diagnosis not present

## 2022-09-17 DIAGNOSIS — R600 Localized edema: Secondary | ICD-10-CM | POA: Diagnosis not present

## 2022-09-17 DIAGNOSIS — I878 Other specified disorders of veins: Secondary | ICD-10-CM

## 2022-09-17 NOTE — Progress Notes (Signed)
[name]    [id number]    08/29/1955  Primary Care Physician:Miller, Irving Burton, DO  Referring Physician: Glendale Chard, DO [address] [place],  [place] [address] Reason for Consultation: pulmonary hypertension Date of Consultation: 09/17/2022  Chief complaint:   Chief Complaint  Patient presents with   Follow-up    Pt had echo 5/21 which showed pulmonary hypertension      HPI: [name] is a 67 y.o. woman who presents for new patient evaluation for pulmonary hypertension  She went to her PCP for lower extremity edema.   She had an echocardiogram Sep 07 2022 which showed mildly elevated PASP, RSVP 44.7 mm Hg. Otherwise it was normal.   She has since started wearing compression socks has gotten a little better. Occasional with foot and ankle tenderness.   Denies dyspnea. No chest tightness, wheezing. She has an occasional light cough. She has chest pain when she gets nervous. Feeling if sharp, lasts for seconds, resolves spontaneously.   She has some chronic lower back pain and has pain that shoots down her thighs.   Has not been prescribed any diuretics. Doesn't really take many daily medications.   She has poor sleep - denies snoring, witnessed apneas. Wakes up in the morning feeling unrefreshed.   Epworth Sleepiness Scale  Chance of dozing off while sitting and reading? 0  1  2  3   4   2.   Chance of dozing off while watching TV?  0  1  2  3   4   3.   Chance of dozing off while Sitting, inactive in a public place?  0  1  2  3   4   4.   Chance of dozing off as a passenger in a car for an hour without a break?  0  1  2  3   4   5.   Chance of dozing off while lying down to rest in the afternoon when circumstances permit?  0  1  2  3   4   6.   Chance of dozing off while sitting and talking to someone?  0  1  2  3   4   7.   Chance of dozing off while sitting quietly after lunch without alcohol?  0  1  2  3   4   8.   Chance of dozing off while in a car,  while stopped for a few minutes in traffic? 0  1  2  3   4     Total ESS 5/24   Social history:  Occupation: has worked Engineering geologist.  Exposures: lives at home with her daughter.  Smoking history: 1/2 ppd x 40 years. = pack years  Social History   Occupational History   Not on file  Tobacco Use   Smoking status: Former    Packs/day: 0.50    Years: 40.00    Additional pack years: 0.00    Total pack years: 20.00    Types: Cigarettes    Quit date: 2007    Years since quitting: 17.4   Smokeless tobacco: Never   Tobacco comments:    in 2007  Substance and Sexual Activity   Alcohol use: No   Drug use: No   Sexual activity: Not on file    Relevant family history:  Family History  Problem Relation Age of Onset   Diabetes Mother    Heart failure Father  Diabetes Father    Lung cancer Neg Hx    Autoimmune disease Neg Hx     Past Medical History:  Diagnosis Date   Anxiety    Chest pain    5-6 years ago, EKG done and everything okay per pt report   GERD (gastroesophageal reflux disease)    Headache    migraines   Thyroid disorder    pt unsure what was wrong, but it was a long time ago    Past Surgical History:  Procedure Laterality Date   ABDOMINAL HYSTERECTOMY     GAS/FLUID EXCHANGE Right 01/05/2016   Procedure: GAS/FLUID EXCHANGE;  Surgeon: Stephannie Li, MD;  Location: [facility] OR;  Service: Ophthalmology;  Laterality: Right;   INJECTION OF SILICONE OIL Right 02/19/2016   Procedure: INJECTION OF SILICONE OIL IN RIGHT EYE;  Surgeon: Stephannie Li, MD;  Location: [facility] OR;  Service: Ophthalmology;  Laterality: Right;   LASER PHOTO ABLATION Right 01/05/2016   Procedure: LASER PHOTO ABLATION;  Surgeon: Stephannie Li, MD;  Location: [facility] OR;  Service: Ophthalmology;  Laterality: Right;   LASER PHOTO ABLATION Right 02/19/2016   Procedure: LASER PHOTO ABLATION OF RIGHT EYE;  Surgeon: Stephannie Li, MD;  Location: [facility] OR;  Service: Ophthalmology;  Laterality: Right;   PARS PLANA  VITRECTOMY Right 01/05/2016   Procedure: PARS PLANA VITRECTOMY WITH 25 GAUGE;  Surgeon: Stephannie Li, MD;  Location: [facility] OR;  Service: Ophthalmology;  Laterality: Right;   PARS PLANA VITRECTOMY Right 05/31/2016   Procedure: PARS PLANA VITRECTOMY WITH 25 GAUGE;  Surgeon: Stephannie Li, MD;  Location: [facility] OR;  Service: Ophthalmology;  Laterality: Right;  Vitrectomy , AC washout, removal of silicone oil right eye   SCLERAL BUCKLE Right 12/18/2015   Procedure: SCLERAL BUCKLE with CRYO and GAS BUBBLE to RIGHT EYE;  Surgeon: Stephannie Li, MD;  Location: [facility] OR;  Service: Ophthalmology;  Laterality: Right;   TONSILLECTOMY       Physical Exam: Blood pressure (!) 140/82, pulse 60, temperature 97.8 F (36.6 C), height 5\' 5"  (1.651 m), weight 233 lb 6.4 oz (105.9 kg), SpO2 100 %. Gen:      No acute distress, HOH right side ENT:  no nasal polyps, mucus membranes moist Lungs:    No increased respiratory effort, symmetric chest wall excursion, clear to auscultation bilaterally, no wheezes or crackles CV:         Regular rate and rhythm; no murmurs, rubs, or gallops.  No pedal edema Abd:      + bowel sounds; soft, non-tender; no distension MSK: no acute synovitis of DIP or PIP joints, no mechanics hands.  Skin:      Warm and dry; no rashes Neuro: normal speech, no focal facial asymmetry Psych: alert and oriented x3, normal mood and affect   Data Reviewed/Medical Decision Making:  Independent interpretation of tests:  Echocardiogram May 2024  1. Left ventricular ejection fraction, by estimation, is 65 to 70%. Left  ventricular ejection fraction by 2D MOD biplane is 68.0 %. The left  ventricle has normal function. The left ventricle has no regional wall  motion abnormalities. Left ventricular  diastolic parameters were normal.   2. Right ventricular systolic function is normal. The right ventricular  size is normal. There is mildly elevated pulmonary artery systolic  pressure. The estimated right  ventricular systolic pressure is 44.7 mmHg.   3. The mitral valve is grossly normal. Trivial mitral valve  regurgitation. No evidence of mitral stenosis.   4. The aortic valve is tricuspid.  Aortic valve regurgitation is not  visualized. No aortic stenosis is present.   5. The inferior vena cava is dilated in size with >50% respiratory  variability, suggesting right atrial pressure of 8 mmHg.    Labs:  Lab Results  Component Value Date   NA 140 08/23/2022   K 4.0 08/23/2022   CO2 24 08/23/2022   GLUCOSE 86 08/23/2022   BUN 17 08/23/2022   CREATININE 1.02 (H) 08/23/2022   CALCIUM 9.9 08/23/2022   EGFR 60 08/23/2022   GFRNONAA >60 05/31/2016   Lab Results  Component Value Date   WBC 5.0 05/31/2016   HGB 13.0 05/31/2016   HCT 39.6 05/31/2016   MCV 91.9 05/31/2016   PLT 246 05/31/2016     Immunization status:   There is no immunization history on file for this patient.   I reviewed prior external note(s) from pcp  I reviewed the result(s) of the labs and imaging as noted above.   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  She has no family or personal history of autoimmune disease. She has no dyspnea. No personal or family history of VTE. The likelihood that this is group 1 PAH requiring anything beyond diuretics is fairly low given age, exam, symptoms, absence of co-morbid conditions.   Will order BNP for evaluation. She can get this drawn after she sees PCP.   The presence of lower extremity edema suggests that this is not isolated right sided heart failure  May require some diuretics prn. Suspect most of her le edema is chronic venous stasis. Agree with compression hose as she is already doing.   I spent 45 minutes in the care of this patient today including pre-charting, chart review,  review of results, face-to-face care, coordination of care and communication with consultants etc.).   Return to Care: Return if symptoms worsen or fail to improve.  Durel Salts, MD Pulmonary and Critical Care Medicine [facility] [phone]  CC: Glendale Chard, DO

## 2022-09-17 NOTE — Patient Instructions (Addendum)
The echocardiogram of your heart showed mild elevated pressures in the blood vessels that connect your heart and lungs.  This doesn't seem to be causing you too many issues.  I recommend elevating your legs and compression hose as you are already doing.   Please get a blood test done for me after you see your primary doctor next.  A fluid pill may be helpful to use occasionally to manage extra swelling.

## 2022-09-27 ENCOUNTER — Encounter: Payer: Self-pay | Admitting: Student

## 2022-09-27 ENCOUNTER — Ambulatory Visit (INDEPENDENT_AMBULATORY_CARE_PROVIDER_SITE_OTHER): Payer: Medicare HMO | Admitting: Student

## 2022-09-27 VITALS — BP 121/57 | HR 72 | Ht 65.0 in | Wt 231.6 lb

## 2022-09-27 DIAGNOSIS — R1319 Other dysphagia: Secondary | ICD-10-CM | POA: Diagnosis not present

## 2022-09-27 DIAGNOSIS — H918X2 Other specified hearing loss, left ear: Secondary | ICD-10-CM | POA: Diagnosis not present

## 2022-09-27 DIAGNOSIS — R600 Localized edema: Secondary | ICD-10-CM

## 2022-09-27 DIAGNOSIS — L989 Disorder of the skin and subcutaneous tissue, unspecified: Secondary | ICD-10-CM | POA: Diagnosis not present

## 2022-09-27 DIAGNOSIS — G8929 Other chronic pain: Secondary | ICD-10-CM

## 2022-09-27 DIAGNOSIS — M545 Low back pain, unspecified: Secondary | ICD-10-CM

## 2022-09-27 MED ORDER — DICLOFENAC SODIUM 1 % EX GEL: 4.0000 g | Freq: Four times a day (QID) | CUTANEOUS | 1 refills | Status: AC

## 2022-09-27 MED ORDER — PANTOPRAZOLE SODIUM 40 MG PO TBEC: 40.0000 mg | DELAYED_RELEASE_TABLET | Freq: Every day | ORAL | 0 refills | Status: DC

## 2022-09-27 MED ORDER — FUROSEMIDE 20 MG PO TABS: 20.0000 mg | ORAL_TABLET | Freq: Every day | ORAL | 3 refills | Status: DC | PRN

## 2022-09-27 MED ORDER — LIDOCAINE 4 % EX PTCH: 1.0000 | MEDICATED_PATCH | CUTANEOUS | 3 refills | Status: AC

## 2022-09-27 NOTE — Assessment & Plan Note (Addendum)
Patient would like lesion removed. Concerned that lesion could be a small basal cell with appearance and patient history. Could also be wart v. Cyst. Will send a message to dermatology clinic for patient scheduling.

## 2022-09-27 NOTE — Assessment & Plan Note (Signed)
Sent message to referral coordinator to follow up on appointment

## 2022-09-27 NOTE — Assessment & Plan Note (Signed)
Most likely 2/2 to venous stasis.  - compression socks  - Lasix 20 mg PRN for LE edema

## 2022-09-27 NOTE — Patient Instructions (Signed)
It was great to see you today!   Today we addressed: Back Pain: I am prescribing voltaren gel and lidocaine patches. You can apply the voltaren gel four times per day.  Trouble swallowing, I am ordering imaging of your neck to evaluate you swallowing.This  will have to be approved by insurance and may take a few weeks to schedule.  Lets try a medication called Protonix in the mean time to see if that helps with some of your symptoms.  I am emailing our referral coordinator to follow up on the referral placed to ENT for your hearing.   I would like to see you back in 1-2 months.   Please arrive 15 minutes before your appointment to ensure smooth check in process.    Please call the clinic at [phone] if your symptoms worsen or you have any concerns.  Thank you for allowing me to participate in your care, Dr. Glendale Chard [facility]

## 2022-09-27 NOTE — Assessment & Plan Note (Signed)
Shared decision making to order barium swallow to evaluate difficulty swallowing and food getting stuck in her throat. It does not happen all the time which does not make me concerned for complete esophageal stricture.  - trial Protonix 40 mg  - continue discussions of going to GI for workup

## 2022-09-27 NOTE — Progress Notes (Signed)
    SUBJECTIVE:   CHIEF COMPLAINT / HPI:   [name] is a 67 y.o. female  presenting for follow up of LE edema, chronic back pain, hearing loss, and esophageal dysphagia.   LE Edema: S/P TTE which was otherwise normal aside of elevated RVSP. Patient was evaluated by pulmonology who said she had very mild disease with no further workup indicated and recommended Lasix 20 mg PRN weith compression socks.   Chronic back pain:  Does not radiate, no saddle anesthesia or incontinence. Controlled with ibuprofen and tylenol. Patient not interested in PT.   Hearing Loss: Waiting on appointment with ENT for hearing aids.   Esophageal Dysphagia:  Patient unwilling to undergo EGD due to family trauma of her son dying from a "simple neck surgery". She has trouble with solids and liquids and when asked where the food gets stuck points to her throat.   Skin Lesion:  Present on her right ankle. Does not itch or cause her pain. Concerned because she had several lesions removed on her arms many years ago for concern for cancer.   PERTINENT  PMH / PSH: Reviewed and updated   OBJECTIVE:   BP (!) 121/57   Pulse 72   Ht 5\' 5"  (1.651 m)   Wt 231 lb 9.6 oz (105.1 kg)   SpO2 98%   BMI 38.54 kg/m   Well-appearing, no acute distress Cardio: Regular rate, regular rhythm, no murmurs on exam. Pulm: Clear, no wheezing, no crackles. No increased work of breathing Abdominal: bowel sounds present, soft, non-tender, non-distended Extremities: no peripheral edema  Neuro: alert and oriented x3, speech normal in content, no facial asymmetry, strength intact and equal bilaterally in UE and LE, pupils equal and reactive to light.  Psych:  Cognition and judgment appear intact. Alert, communicative  and cooperative with normal attention span and concentration. No apparent delusions, illusions, hallucinations  Skin: small, raised, well-circumscribed, scaling lesion on left lateral ankle, no surrounding erythema or  tenderness to palpation       09/27/2022    3:15 PM  PHQ9 SCORE ONLY  PHQ-9 Total Score 1      ASSESSMENT/PLAN:   Esophageal dysphagia Shared decision making to order barium swallow to evaluate difficulty swallowing and food getting stuck in her throat. It does not happen all the time which does not make me concerned for complete esophageal stricture.  - trial Protonix 40 mg  - continue discussions of going to GI for workup  Bilateral edema of lower extremity Most likely 2/2 to venous stasis.  - compression socks  - Lasix 20 mg PRN for LE edema   HOH (hard of hearing) Sent message to referral coordinator to follow up on appointment   Chronic back pain No red flag symptoms. Will continue to manage with conservative treatment.  - prescribed voltaren gel  - prescribed Lidocaine patch  - cont tylenol PRN  Skin lesion Patient would like lesion removed. Concerned that lesion could be a small basal cell with appearance and patient history. Could also be wart v. Cyst. Will send a message to dermatology clinic for patient scheduling.      Glendale Chard, DO [facility]

## 2022-09-27 NOTE — Assessment & Plan Note (Signed)
No red flag symptoms. Will continue to manage with conservative treatment.  - prescribed voltaren gel  - prescribed Lidocaine patch  - cont tylenol PRN

## 2022-09-28 ENCOUNTER — Encounter: Payer: Self-pay | Admitting: *Deleted

## 2022-10-04 ENCOUNTER — Telehealth: Payer: Self-pay | Admitting: Student

## 2022-10-04 ENCOUNTER — Ambulatory Visit
Admission: RE | Admit: 2022-10-04 | Discharge: 2022-10-04 | Disposition: A | Discharge status: Home / Self Care | Payer: Medicare HMO | Source: Ambulatory Visit | Attending: Family Medicine | Admitting: Family Medicine

## 2022-10-04 DIAGNOSIS — K449 Diaphragmatic hernia without obstruction or gangrene: Secondary | ICD-10-CM | POA: Diagnosis not present

## 2022-10-04 DIAGNOSIS — R1319 Other dysphagia: Secondary | ICD-10-CM

## 2022-10-04 DIAGNOSIS — R1314 Dysphagia, pharyngoesophageal phase: Secondary | ICD-10-CM | POA: Diagnosis not present

## 2022-10-04 NOTE — Telephone Encounter (Signed)
Reviewed form and placed in PCP's box for completion.  .Hailei Besser R Shahid Flori, CMA  

## 2022-10-04 NOTE — Telephone Encounter (Signed)
Patient's daughter dropped off DMV form to be completed. Last DOS was 09/27/22. Placed in [employer].

## 2022-10-07 ENCOUNTER — Ambulatory Visit: Payer: Medicare HMO

## 2022-10-07 ENCOUNTER — Telehealth: Payer: Self-pay

## 2022-10-07 NOTE — Telephone Encounter (Signed)
Form placed up front for pick up.   Copy made for batch scanning.   Daughter has been made aware.

## 2022-10-07 NOTE — Telephone Encounter (Signed)
Daughter is requesting results from swallowing test.   She reports they saw the results in mychart.   Will forward to PCP.

## 2022-10-08 ENCOUNTER — Telehealth: Payer: Self-pay | Admitting: Student

## 2022-10-08 DIAGNOSIS — R1319 Other dysphagia: Secondary | ICD-10-CM

## 2022-10-08 DIAGNOSIS — K224 Dyskinesia of esophagus: Secondary | ICD-10-CM

## 2022-10-08 NOTE — Telephone Encounter (Signed)
Called patient's daughter to discuss results of barium swallow study. Overall results are normal but did show some esophageal dysmotility which is most likely contributing to her choking spells. Offered to send referral to speech therapy.   Patient has an ENT for evaluation.   Glendale Chard, DO [facility], PGY-1 10/08/22 2:03 PM

## 2022-10-15 ENCOUNTER — Telehealth: Payer: Self-pay

## 2022-10-15 NOTE — Telephone Encounter (Signed)
Patients daughter calls nurse line in regards to pain management.   She reports her mother has been alternating Ibuprofen and Tylenol with no pain relief. She reports she has also been using Voltaren Gel with no relief.   She reports she has been in constant pain and would like to try a muscle relaxer.   Patient has a FU apt with PCP on 7/15.  Will forward to PCP.

## 2022-10-15 NOTE — Telephone Encounter (Signed)
Called to discuss new onset back pain with patient's daughter Karen Kitchens. Patient was out in the yard and over did it and had an exacerbation of her chronic back pain. She is not having any incontinence or shooting pain down her legs. She is using tylenol, ibuprofen, and voltaren gel without significant relief. Discussed caution with prescribing muscle relaxer's with patient's age and balance issues.   Daughter in agreement to continue conservative treatment. Encouraged use of lidocaine patches and heating pad to help with symptomatic relief. Also encouraged ambulation as tolerated as being sedentary can worsen the pain. Offered PT but daughter politely declined. If desired will place PT referral.   Follow up scheduled for 7/15.   Glendale Chard, DO [facility], PGY-1 10/15/22 11:25 AM

## 2022-10-24 NOTE — Patient Instructions (Incomplete)
[name] , Thank you for taking time to come for your Medicare Wellness Visit. I appreciate your ongoing commitment to your health goals. Please review the following plan we discussed and let me know if I can assist you in the future.   These are the goals we discussed:  Goals   None     This is a list of the screening recommended for you and due dates:  Health Maintenance  Topic Date Due   Medicare Annual Wellness Visit  Never done   COVID-19 Vaccine (1) Never done   DTaP/Tdap/Td vaccine (1 - Tdap) Never done   Colon Cancer Screening  Never done   Mammogram  Never done   Zoster (Shingles) Vaccine (1 of 2) Never done   Pneumonia Vaccine (1 of 1 - PCV) Never done   DEXA scan (bone density measurement)  Never done   Flu Shot  11/18/2022   Hepatitis C Screening  Completed   HPV Vaccine  Aged Out    Advanced directives: Information on Advanced Care Planning can be found at [facility] Advance Health Care Directives Advance Health Care Directives ([email or web address] Please bring a copy of your health care power of attorney and living will to the office to be added to your chart at your convenience.  Conditions/risks identified: Aim for 30 minutes of exercise or brisk walking, 6-8 glasses of water, and 5 servings of fruits and vegetables each day.  Next appointment: Follow up in one year for your annual wellness visit    Preventive Care 65 Years and Older, Female Preventive care refers to lifestyle choices and visits with your health care provider that can promote health and wellness. What does preventive care include? A yearly physical exam. This is also called an annual well check. Dental exams once or twice a year. Routine eye exams. Ask your health care provider how often you should have your eyes checked. Personal lifestyle choices, including: Daily care of your teeth and gums. Regular physical activity. Eating a healthy diet. Avoiding tobacco and drug  use. Limiting alcohol use. Practicing safe sex. Taking low-dose aspirin every day. Taking vitamin and mineral supplements as recommended by your health care provider. What happens during an annual well check? The services and screenings done by your health care provider during your annual well check will depend on your age, overall health, lifestyle risk factors, and family history of disease. Counseling  Your health care provider may ask you questions about your: Alcohol use. Tobacco use. Drug use. Emotional well-being. Home and relationship well-being. Sexual activity. Eating habits. History of falls. Memory and ability to understand (cognition). Work and work Astronomer. Reproductive health. Screening  You may have the following tests or measurements: Height, weight, and BMI. Blood pressure. Lipid and cholesterol levels. These may be checked every 5 years, or more frequently if you are over 50 years old. Skin check. Lung cancer screening. You may have this screening every year starting at age 100 if you have a 30-pack-year history of smoking and currently smoke or have quit within the past 15 years. Fecal occult blood test (FOBT) of the stool. You may have this test every year starting at age 81. Flexible sigmoidoscopy or colonoscopy. You may have a sigmoidoscopy every 5 years or a colonoscopy every 10 years starting at age 67. Hepatitis C blood test. Hepatitis B blood test. Sexually transmitted disease (STD) testing. Diabetes screening. This is done by checking your blood sugar (glucose) after you have not eaten  for a while (fasting). You may have this done every 1-3 years. Bone density scan. This is done to screen for osteoporosis. You may have this done starting at age 26. Mammogram. This may be done every 1-2 years. Talk to your health care provider about how often you should have regular mammograms. Talk with your health care provider about your test results, treatment  options, and if necessary, the need for more tests. Vaccines  Your health care provider may recommend certain vaccines, such as: Influenza vaccine. This is recommended every year. Tetanus, diphtheria, and acellular pertussis (Tdap, Td) vaccine. You may need a Td booster every 10 years. Zoster vaccine. You may need this after age 22. Pneumococcal 13-valent conjugate (PCV13) vaccine. One dose is recommended after age 72. Pneumococcal polysaccharide (PPSV23) vaccine. One dose is recommended after age 47. Talk to your health care provider about which screenings and vaccines you need and how often you need them. This information is not intended to replace advice given to you by your health care provider. Make sure you discuss any questions you have with your health care provider. Document Released: 05/02/2015 Document Revised: 12/24/2015 Document Reviewed: 02/04/2015 Elsevier Interactive Patient Education  2017 [employer].  Fall Prevention in the Home Falls can cause injuries. They can happen to people of all ages. There are many things you can do to make your home safe and to help prevent falls. What can I do on the outside of my home? Regularly fix the edges of walkways and driveways and fix any cracks. Remove anything that might make you trip as you walk through a door, such as a raised step or threshold. Trim any bushes or trees on the path to your home. Use bright outdoor lighting. Clear any walking paths of anything that might make someone trip, such as rocks or tools. Regularly check to see if handrails are loose or broken. Make sure that both sides of any steps have handrails. Any raised decks and porches should have guardrails on the edges. Have any leaves, snow, or ice cleared regularly. Use sand or salt on walking paths during winter. Clean up any spills in your garage right away. This includes oil or grease spills. What can I do in the bathroom? Use night lights. Install grab  bars by the toilet and in the tub and shower. Do not use towel bars as grab bars. Use non-skid mats or decals in the tub or shower. If you need to sit down in the shower, use a plastic, non-slip stool. Keep the floor dry. Clean up any water that spills on the floor as soon as it happens. Remove soap buildup in the tub or shower regularly. Attach bath mats securely with double-sided non-slip rug tape. Do not have throw rugs and other things on the floor that can make you trip. What can I do in the bedroom? Use night lights. Make sure that you have a light by your bed that is easy to reach. Do not use any sheets or blankets that are too big for your bed. They should not hang down onto the floor. Have a firm chair that has side arms. You can use this for support while you get dressed. Do not have throw rugs and other things on the floor that can make you trip. What can I do in the kitchen? Clean up any spills right away. Avoid walking on wet floors. Keep items that you use a lot in easy-to-reach places. If you need to reach something  above you, use a strong step stool that has a grab bar. Keep electrical cords out of the way. Do not use floor polish or wax that makes floors slippery. If you must use wax, use non-skid floor wax. Do not have throw rugs and other things on the floor that can make you trip. What can I do with my stairs? Do not leave any items on the stairs. Make sure that there are handrails on both sides of the stairs and use them. Fix handrails that are broken or loose. Make sure that handrails are as long as the stairways. Check any carpeting to make sure that it is firmly attached to the stairs. Fix any carpet that is loose or worn. Avoid having throw rugs at the top or bottom of the stairs. If you do have throw rugs, attach them to the floor with carpet tape. Make sure that you have a light switch at the top of the stairs and the bottom of the stairs. If you do not have them,  ask someone to add them for you. What else can I do to help prevent falls? Wear shoes that: Do not have high heels. Have rubber bottoms. Are comfortable and fit you well. Are closed at the toe. Do not wear sandals. If you use a stepladder: Make sure that it is fully opened. Do not climb a closed stepladder. Make sure that both sides of the stepladder are locked into place. Ask someone to hold it for you, if possible. Clearly mark and make sure that you can see: Any grab bars or handrails. First and last steps. Where the edge of each step is. Use tools that help you move around (mobility aids) if they are needed. These include: Canes. Walkers. Scooters. Crutches. Turn on the lights when you go into a dark area. Replace any light bulbs as soon as they burn out. Set up your furniture so you have a clear path. Avoid moving your furniture around. If any of your floors are uneven, fix them. If there are any pets around you, be aware of where they are. Review your medicines with your doctor. Some medicines can make you feel dizzy. This can increase your chance of falling. Ask your doctor what other things that you can do to help prevent falls. This information is not intended to replace advice given to you by your health care provider. Make sure you discuss any questions you have with your health care provider. Document Released: 01/30/2009 Document Revised: 09/11/2015 Document Reviewed: 05/10/2014 Elsevier Interactive Patient Education  2017 [employer].

## 2022-10-24 NOTE — Progress Notes (Unsigned)
Subjective:   [name] is a 67 y.o. female who presents for an Initial Medicare Annual Wellness Visit.  Visit Complete: {VISITMETHOD:[phone]}  Patient Medicare AWV questionnaire was completed by the patient on ***; I have confirmed that all information answered by patient is correct and no changes since this date.  Review of Systems    ***       Objective:    There were no vitals filed for this visit. There is no height or weight on file to calculate BMI.     09/27/2022    3:15 PM 08/23/2022    3:04 PM 05/31/2016   10:24 AM 02/19/2016   12:51 PM 01/05/2016    2:21 PM 12/18/2015   10:47 AM  Advanced Directives  Does Patient Have a Medical Advance Directive? No No No No No No  Would patient like information on creating a medical advance directive? Yes (MAU/Ambulatory/Procedural Areas - Information given) Yes (MAU/Ambulatory/Procedural Areas - Information given) Yes (MAU/Ambulatory/Procedural Areas - Information given) No - patient declined information No - patient declined information No - patient declined information    Current Medications (verified) Outpatient Encounter Medications as of 10/25/2022  Medication Sig   aspirin-acetaminophen-caffeine (EXCEDRIN MIGRAINE) 250-250-65 MG tablet Take 1 tablet by mouth every 6 (six) hours as needed for headache.   calcium carbonate (TUMS EX) 750 MG chewable tablet Chew 1 tablet by mouth 2 (two) times daily as needed for heartburn.   diclofenac Sodium (VOLTAREN) 1 % GEL Apply 4 g topically 4 (four) times daily.   furosemide (LASIX) 20 MG tablet Take 1 tablet (20 mg total) by mouth daily as needed for edema.   lidocaine (HM LIDOCAINE PATCH) 4 % Place 1 patch onto the skin daily.   pantoprazole (PROTONIX) 40 MG tablet Take 1 tablet (40 mg total) by mouth daily.   No facility-administered encounter medications on file as of 10/25/2022.    Allergies (verified) Vicodin [hydrocodone-acetaminophen] and Bc fast pain  [aspirin-salicylamide-caffeine]   History: Past Medical History:  Diagnosis Date   Anxiety    Chest pain    5-6 years ago, EKG done and everything okay per pt report   GERD (gastroesophageal reflux disease)    Headache    migraines   Thyroid disorder    pt unsure what was wrong, but it was a long time ago   Past Surgical History:  Procedure Laterality Date   ABDOMINAL HYSTERECTOMY     GAS/FLUID EXCHANGE Right 01/05/2016   Procedure: GAS/FLUID EXCHANGE;  Surgeon: Stephannie Li, MD;  Location: [facility] OR;  Service: Ophthalmology;  Laterality: Right;   INJECTION OF SILICONE OIL Right 02/19/2016   Procedure: INJECTION OF SILICONE OIL IN RIGHT EYE;  Surgeon: Stephannie Li, MD;  Location: [facility] OR;  Service: Ophthalmology;  Laterality: Right;   LASER PHOTO ABLATION Right 01/05/2016   Procedure: LASER PHOTO ABLATION;  Surgeon: Stephannie Li, MD;  Location: [facility] OR;  Service: Ophthalmology;  Laterality: Right;   LASER PHOTO ABLATION Right 02/19/2016   Procedure: LASER PHOTO ABLATION OF RIGHT EYE;  Surgeon: Stephannie Li, MD;  Location: [facility] OR;  Service: Ophthalmology;  Laterality: Right;   PARS PLANA VITRECTOMY Right 01/05/2016   Procedure: PARS PLANA VITRECTOMY WITH 25 GAUGE;  Surgeon: Stephannie Li, MD;  Location: [facility] OR;  Service: Ophthalmology;  Laterality: Right;   PARS PLANA VITRECTOMY Right 05/31/2016   Procedure: PARS PLANA VITRECTOMY WITH 25 GAUGE;  Surgeon: Stephannie Li, MD;  Location: [facility] OR;  Service: Ophthalmology;  Laterality: Right;  Vitrectomy ,  AC washout, removal of silicone oil right eye   SCLERAL BUCKLE Right 12/18/2015   Procedure: SCLERAL BUCKLE with CRYO and GAS BUBBLE to RIGHT EYE;  Surgeon: Stephannie Li, MD;  Location: [facility] OR;  Service: Ophthalmology;  Laterality: Right;   TONSILLECTOMY     Family History  Problem Relation Age of Onset   Diabetes Mother    Heart failure Father    Diabetes Father    Lung cancer Neg Hx    Autoimmune disease Neg Hx    Social History    Socioeconomic History   Marital status: Widowed    Spouse name: Not on file   Number of children: Not on file   Years of education: Not on file   Highest education level: Not on file  Occupational History   Not on file  Tobacco Use   Smoking status: Former    Packs/day: 0.50    Years: 40.00    Additional pack years: 0.00    Total pack years: 20.00    Types: Cigarettes    Quit date: 2007    Years since quitting: 17.5   Smokeless tobacco: Never   Tobacco comments:    in 2007  Substance and Sexual Activity   Alcohol use: No   Drug use: No   Sexual activity: Not on file  Other Topics Concern   Not on file  Social History Narrative   Not on file   Social Determinants of Health   Financial Resource Strain: Not on file  Food Insecurity: Not on file  Transportation Needs: Not on file  Physical Activity: Not on file  Stress: Not on file  Social Connections: Not on file    Tobacco Counseling Counseling given: Not Answered Tobacco comments: in 2007   Clinical Intake:                        Activities of Daily Living     No data to display          Patient Care Team: Glendale Chard, DO as PCP - General (Family Medicine)  Indicate any recent Medical Services you may have received from other than [facility] providers in the past year (date may be approximate).     Assessment:   This is a routine wellness examination for [place].  Hearing/Vision screen No results found.  Dietary issues and exercise activities discussed:     Goals Addressed   None    Depression Screen    09/27/2022    3:15 PM  PHQ 2/9 Scores  PHQ - 2 Score 0  PHQ- 9 Score 1    Fall Risk    09/27/2022    3:15 PM  Fall Risk   Falls in the past year? 0  Number falls in past yr: 0  Injury with Fall? 0    MEDICARE RISK AT HOME:   TIMED UP AND GO:  Was the test performed? No    Cognitive Function:        Immunizations  There is no immunization history on file  for this patient.  {TDAP status:2101805}  {Pneumococcal vaccine status:2101807}  Covid-19 vaccine status: Information provided on how to obtain vaccines.   Qualifies for Shingles Vaccine? Yes   Zostavax completed No   Shingrix Completed?: No.    Education has been provided regarding the importance of this vaccine. Patient has been advised to call insurance company to determine out of pocket expense if they have not yet received this  vaccine. Advised may also receive vaccine at local pharmacy or Health Dept. Verbalized acceptance and understanding.  Screening Tests Health Maintenance  Topic Date Due   Medicare Annual Wellness (AWV)  Never done   COVID-19 Vaccine (1) Never done   DTaP/Tdap/Td (1 - Tdap) Never done   Colonoscopy  Never done   MAMMOGRAM  Never done   Zoster Vaccines- Shingrix (1 of 2) Never done   Pneumonia Vaccine 50+ Years old (1 of 1 - PCV) Never done   DEXA SCAN  Never done   INFLUENZA VACCINE  11/18/2022   Hepatitis C Screening  Completed   HPV VACCINES  Aged Out    Health Maintenance  Health Maintenance Due  Topic Date Due   Medicare Annual Wellness (AWV)  Never done   COVID-19 Vaccine (1) Never done   DTaP/Tdap/Td (1 - Tdap) Never done   Colonoscopy  Never done   MAMMOGRAM  Never done   Zoster Vaccines- Shingrix (1 of 2) Never done   Pneumonia Vaccine 67+ Years old (1 of 1 - PCV) Never done   DEXA SCAN  Never done    {Colorectal cancer screening:2101809}  {Mammogram status:21018020}  {Bone Density status:21018021}  Lung Cancer Screening: (Low Dose CT Chest recommended if Age 51-80 years, 20 pack-year currently smoking OR have quit w/in 15years.) does not qualify.   Lung Cancer Screening Referral: n/a  Additional Screening:  Hepatitis C Screening: does qualify; Completed 08/23/22  Vision Screening: Recommended annual ophthalmology exams for early detection of glaucoma and other disorders of the eye. Is the patient up to date with their annual  eye exam?  {YES/NO:21197} Who is the provider or what is the name of the office in which the patient attends annual eye exams? *** If pt is not established with a provider, would they like to be referred to a provider to establish care? {YES/NO:21197}.   Dental Screening: Recommended annual dental exams for proper oral hygiene  Community Resource Referral / Chronic Care Management: CRR required this visit?  {YES/NO:21197}  CCM required this visit?  {CCM Required [phone]}     Plan:     I have personally reviewed and noted the following in the patient's chart:   Medical and social history Use of alcohol, tobacco or illicit drugs  Current medications and supplements including opioid prescriptions. {Opioid [phone]} Functional ability and status Nutritional status Physical activity Advanced directives List of other physicians Hospitalizations, surgeries, and ER visits in previous 12 months Vitals Screenings to include cognitive, depression, and falls Referrals and appointments  In addition, I have reviewed and discussed with patient certain preventive protocols, quality metrics, and best practice recommendations. A written personalized care plan for preventive services as well as general preventive health recommendations were provided to patient.     Kandis Fantasia [place], [place]   04/24/1094   After Visit Summary: {CHL AMB AWV After Visit [phone]}  Nurse Notes: ***

## 2022-10-25 ENCOUNTER — Ambulatory Visit (INDEPENDENT_AMBULATORY_CARE_PROVIDER_SITE_OTHER): Payer: Medicare HMO

## 2022-10-25 VITALS — Ht 65.0 in | Wt 231.0 lb

## 2022-10-25 DIAGNOSIS — Z0000 Encounter for general adult medical examination without abnormal findings: Secondary | ICD-10-CM | POA: Diagnosis not present

## 2022-11-01 ENCOUNTER — Encounter: Payer: Self-pay | Admitting: Student

## 2022-11-01 ENCOUNTER — Ambulatory Visit (INDEPENDENT_AMBULATORY_CARE_PROVIDER_SITE_OTHER): Payer: Medicare HMO | Admitting: Student

## 2022-11-01 ENCOUNTER — Other Ambulatory Visit: Payer: Self-pay

## 2022-11-01 VITALS — BP 140/64 | HR 74 | Ht 65.0 in | Wt 236.8 lb

## 2022-11-01 DIAGNOSIS — Z1211 Encounter for screening for malignant neoplasm of colon: Secondary | ICD-10-CM | POA: Diagnosis not present

## 2022-11-01 DIAGNOSIS — E782 Mixed hyperlipidemia: Secondary | ICD-10-CM

## 2022-11-01 DIAGNOSIS — H918X2 Other specified hearing loss, left ear: Secondary | ICD-10-CM

## 2022-11-01 DIAGNOSIS — E785 Hyperlipidemia, unspecified: Secondary | ICD-10-CM | POA: Insufficient documentation

## 2022-11-01 MED ORDER — ATORVASTATIN CALCIUM 20 MG PO TABS: 20.0000 mg | ORAL_TABLET | Freq: Every day | ORAL | 3 refills | Status: DC

## 2022-11-01 NOTE — Assessment & Plan Note (Signed)
Started patient on 20 mg Lipitor.  Will follow-up for tolerance at next visit

## 2022-11-01 NOTE — Progress Notes (Signed)
    SUBJECTIVE:   CHIEF COMPLAINT / HPI:   [name] is a 67 y.o. female  presenting for follow-up of high cholesterol and hearing difficulties.  She has been referred to ENT but unfortunately her insurance did not cover the provider that she was sent to.  On previous labs her LDL was elevated and she would benefit from statin therapy.  She is open to being started on a medication  PERTINENT  PMH / PSH: Reviewed and updated   OBJECTIVE:   BP (!) 140/64   Pulse 74   Ht 5\' 5"  (1.651 m)   Wt 236 lb 12.8 oz (107.4 kg)   SpO2 96%   BMI 39.41 kg/m   Well-appearing, no acute distress, very hard of hearing Cardio: Regular rate, regular rhythm, no murmurs on exam. Pulm: Clear, no wheezing, no crackles. No increased work of breathing Abdominal: bowel sounds present, soft, non-tender, non-distended Extremities: no peripheral edema  Neuro: alert and oriented x3, speech normal in content, no facial asymmetry, strength intact and equal bilaterally in UE and LE, pupils equal and reactive to light.  Psych:  Cognition and judgment appear intact. Alert, communicative  and cooperative with normal attention span and concentration. No apparent delusions, illusions, hallucinations      11/01/2022    3:17 PM 10/25/2022    1:14 PM 09/27/2022    3:15 PM  PHQ9 SCORE ONLY  PHQ-9 Total Score 0 1 1      ASSESSMENT/PLAN:   HOH (hard of hearing) Will send a message to referral coordinator to help coordinate ENT referral within her insurance network.  Hyperlipidemia Started patient on 20 mg Lipitor.  Will follow-up for tolerance at next visit     Glendale Chard, DO [facility]

## 2022-11-01 NOTE — Patient Instructions (Signed)
It was great to see you today!   Today we addressed: High cholesterol - I started you on a new medication called Lipitor 20 mg. You should take it once before you go to bed.   Future Appointments  Date Time Provider Department Center  11/04/2023  1:00 PM [facility] ANNUAL WELLNESS VISIT [facility]    Please arrive 15 minutes before your appointment to ensure smooth check in process.    Please call the clinic at [phone] if your symptoms worsen or you have any concerns.  Thank you for allowing me to participate in your care, Dr. Glendale Chard [facility]

## 2022-11-01 NOTE — Assessment & Plan Note (Signed)
Will send a message to referral coordinator to help coordinate ENT referral within her insurance network.

## 2022-11-23 DIAGNOSIS — Z1211 Encounter for screening for malignant neoplasm of colon: Secondary | ICD-10-CM | POA: Diagnosis not present

## 2022-11-30 ENCOUNTER — Encounter: Payer: Self-pay | Admitting: Student

## 2022-12-01 ENCOUNTER — Ambulatory Visit (INDEPENDENT_AMBULATORY_CARE_PROVIDER_SITE_OTHER): Payer: Medicare HMO | Admitting: Student

## 2022-12-01 VITALS — BP 133/77 | HR 55 | Ht 65.0 in | Wt 233.0 lb

## 2022-12-01 DIAGNOSIS — R600 Localized edema: Secondary | ICD-10-CM

## 2022-12-01 MED ORDER — MEDICAL COMPRESSION SOCKS MISC: 1.0000 | Freq: Every day | 0 refills | Status: AC

## 2022-12-01 MED ORDER — FUROSEMIDE 20 MG PO TABS: 20.0000 mg | ORAL_TABLET | Freq: Every day | ORAL | 3 refills | Status: DC | PRN

## 2022-12-01 NOTE — Progress Notes (Signed)
    SUBJECTIVE:   CHIEF COMPLAINT / HPI:   [name] is a 67 y.o. female  presenting for bilateral leg swelling. She was originally worked up with an echocardiogram which showed normal LVEF and elevated RVSP at 44 mmHg. She was referred to pulmonology at the time and they had no concerns regarding her pulmonary function. She has been managed with 20 mg Lasix PRN. However in the last few weeks her leg swelling has gotten progressively worse where she is having to take 2 pills of Lasix and sometimes that does not work to get the swelling down. She has bought OTC compression socks that she will wear occasionally but they are tight around the top of her calf causing pain so she won't wear them sometimes.   She denies SOB, CP, or worsening cardiopulmonary symptoms associated with the increased leg swelling.   PERTINENT  PMH / PSH: Reviewed and updated   OBJECTIVE:   BP 133/77   Pulse (!) 55   Ht 5\' 5"  (1.651 m)   Wt 233 lb (105.7 kg)   SpO2 99%   BMI 38.77 kg/m   Well-appearing, no acute distress Cardio: Regular rate, regular rhythm, no murmurs on exam. Pulm: Clear, no wheezing, no crackles. No increased work of breathing Abdominal: bowel sounds present, soft, non-tender, non-distended Extremities: +1 pitting edema, no chronic wounds present  Neuro: alert and oriented x3, speech normal in content, no facial asymmetry, strength intact and equal bilaterally in UE and LE, pupils equal and reactive to light.  Psych:  Cognition and judgment appear intact. Alert, communicative  and cooperative with normal attention span and concentration. No apparent delusions, illusions, hallucinations      12/01/2022    9:18 AM 11/01/2022    3:17 PM 10/25/2022    1:14 PM  PHQ9 SCORE ONLY  PHQ-9 Total Score 1 0 1      ASSESSMENT/PLAN:   Bilateral edema of lower extremity Most likely 2/2 to elevated pulmonary pressures and chronic venous insufficiency. Swelling is very mild today without signs of  weeping or wound formation. Discussed low sodium diet, fluid restriction and wearing compression socks daily.  - will send an order for compression socks to be fitted in hopes they are more comfortable for her to wear.  - discussed using lasix sparingly - only when needed  - if mild interventions do not seem to improve her symptoms within 2 weeks of new interventions will send a referral to sleep medicine for OSA.      Glendale Chard, DO [facility]

## 2022-12-01 NOTE — Assessment & Plan Note (Addendum)
Most likely 2/2 to elevated pulmonary pressures and chronic venous insufficiency. Swelling is very mild today without signs of weeping or wound formation. Discussed low sodium diet, fluid restriction and wearing compression socks daily.  - will send an order for compression socks to be fitted in hopes they are more comfortable for her to wear.  - discussed using lasix sparingly - only when needed  - if mild interventions do not seem to improve her symptoms within 2 weeks of new interventions will send a referral to sleep medicine for OSA.

## 2022-12-01 NOTE — Patient Instructions (Addendum)
It was great to see you today!   Today we addressed: Low sodium diet less than 2,000 mg of sodium/salt  I am sending a referral for compression stockings  Limit the amount of water you are drinking to less than 1800 mL per day You can take up to 3 pills of Lasix per day.   Future Appointments  Date Time Provider Department Center  11/04/2023  1:00 PM [facility] ANNUAL WELLNESS VISIT [facility]    Please arrive 15 minutes before your appointment to ensure smooth check in process.    Please call the clinic at [phone] if your symptoms worsen or you have any concerns.  Thank you for allowing me to participate in your care, Dr. Glendale Chard [facility]

## 2022-12-02 ENCOUNTER — Encounter: Payer: Self-pay | Admitting: Student

## 2022-12-16 ENCOUNTER — Encounter: Payer: Self-pay | Admitting: Student

## 2022-12-22 ENCOUNTER — Other Ambulatory Visit: Payer: Self-pay | Admitting: Student

## 2022-12-22 DIAGNOSIS — R1319 Other dysphagia: Secondary | ICD-10-CM

## 2022-12-29 ENCOUNTER — Telehealth: Payer: Self-pay | Admitting: Student

## 2022-12-29 DIAGNOSIS — R600 Localized edema: Secondary | ICD-10-CM

## 2022-12-29 NOTE — Telephone Encounter (Signed)
DME order placed.

## 2022-12-31 NOTE — Telephone Encounter (Signed)
Community message sent to [employer].

## 2023-01-07 NOTE — Telephone Encounter (Signed)
Patient's daughter returns call to nurse line regarding compression stockings. She states that she has not heard anything and wanted to check the status.   Called [employer]. They no longer carry this item.   [facility]. They are able to assist patient in getting this order.   Called daughter and provided with information for [facility]. They do not need a prescription, as compression level is not higher than 30 mm Hg.   Daughter will call back if there are any further issues.   Veronda Prude, RN

## 2023-03-04 ENCOUNTER — Telehealth: Payer: Self-pay | Admitting: Family Medicine

## 2023-03-04 DIAGNOSIS — H918X3 Other specified hearing loss, bilateral: Secondary | ICD-10-CM

## 2023-03-04 NOTE — Telephone Encounter (Signed)
Received records from hearing instrument specialist regarding patient's left mild to severe sensorineural hearing loss and right profound mixed hearing loss.  At their recommendation, ENT referral placed for medical clearance before being fit with hearing aids.  Appreciate expertise.

## 2023-03-10 ENCOUNTER — Encounter (INDEPENDENT_AMBULATORY_CARE_PROVIDER_SITE_OTHER): Payer: Self-pay | Admitting: Otolaryngology

## 2023-03-11 NOTE — Telephone Encounter (Signed)
Spoke with patients daughter.   ENT information given.

## 2023-03-20 ENCOUNTER — Emergency Department (HOSPITAL_COMMUNITY)
Admission: EM | Admit: 2023-03-20 | Discharge: 2023-03-20 | Disposition: A | Discharge status: Home / Self Care | Payer: Medicare HMO | Attending: Emergency Medicine | Admitting: Emergency Medicine

## 2023-03-20 ENCOUNTER — Other Ambulatory Visit: Payer: Self-pay

## 2023-03-20 ENCOUNTER — Encounter (HOSPITAL_COMMUNITY): Payer: Self-pay | Admitting: Emergency Medicine

## 2023-03-20 DIAGNOSIS — R5383 Other fatigue: Secondary | ICD-10-CM | POA: Insufficient documentation

## 2023-03-20 DIAGNOSIS — Z1152 Encounter for screening for COVID-19: Secondary | ICD-10-CM | POA: Insufficient documentation

## 2023-03-20 DIAGNOSIS — R059 Cough, unspecified: Secondary | ICD-10-CM | POA: Diagnosis not present

## 2023-03-20 DIAGNOSIS — Z7982 Long term (current) use of aspirin: Secondary | ICD-10-CM | POA: Diagnosis not present

## 2023-03-20 DIAGNOSIS — J029 Acute pharyngitis, unspecified: Secondary | ICD-10-CM | POA: Diagnosis not present

## 2023-03-20 LAB — GROUP A STREP BY PCR: Group A Strep by PCR: NOT DETECTED

## 2023-03-20 LAB — SARS CORONAVIRUS 2 BY RT PCR: SARS Coronavirus 2 by RT PCR: NEGATIVE

## 2023-03-20 MED ORDER — AMOXICILLIN 500 MG PO CAPS: 500.0000 mg | ORAL_CAPSULE | Freq: Three times a day (TID) | ORAL | 0 refills | Status: AC

## 2023-03-20 NOTE — ED Triage Notes (Signed)
Thanksgiving night, the patient began to have fatigue, a productive cough and a sore throat. The patient has had 2 at home Covid tests that came back negative.

## 2023-03-20 NOTE — Discharge Instructions (Signed)
You were seen in the emergency department for cough sore throat.  Your strep test and your COVID test were negative.  We are starting you on amoxicillin for possible infection.  Please stay well-hydrated, warm salt water gargles, Tylenol for pain.  Follow-up with your doctor.  Return to the emergency department if any worsening or concerning symptoms

## 2023-03-20 NOTE — ED Provider Notes (Signed)
[place] EMERGENCY DEPARTMENT AT [facility] Provider Note   CSN: [id number] Arrival date & time: 03/20/23  1210     History  Chief Complaint  Patient presents with   Cough   Sore Throat   Fatigue    [name] is a 67 y.o. female.  She is here with nonproductive cough, sore throat for the last 3 days.  No fever.  No shortness of breath.  No sick contacts or recent travel.  She said her neck was swollen earlier when she woke up but she feels that is improved.  The history is provided by the patient.  URI Presenting symptoms: congestion, cough, fatigue and sore throat   Presenting symptoms: no fever   Severity:  Moderate Onset quality:  Gradual Duration:  3 days Timing:  Constant Progression:  Unchanged Chronicity:  New Relieved by:  Nothing Worsened by:  Drinking and eating Ineffective treatments:  OTC medications Associated symptoms: neck pain   Associated symptoms: no headaches and no wheezing   Risk factors: no recent travel and no sick contacts        Home Medications Prior to Admission medications   Medication Sig Start Date End Date Taking? Authorizing Provider  aspirin-acetaminophen-caffeine (EXCEDRIN MIGRAINE) [phone] MG tablet Take 1 tablet by mouth every 6 (six) hours as needed for headache.    [provider]  atorvastatin (LIPITOR) 20 MG tablet Take 1 tablet (20 mg total) by mouth daily. 11/01/22   Glendale Chard, DO  calcium carbonate (TUMS EX) 750 MG chewable tablet Chew 1 tablet by mouth 2 (two) times daily as needed for heartburn.    [provider]  diclofenac Sodium (VOLTAREN) 1 % GEL Apply 4 g topically 4 (four) times daily. 09/27/22   Glendale Chard, DO  Elastic Bandages & Supports (MEDICAL COMPRESSION SOCKS) MISC 1 packet by Does not apply route daily. 12/01/22   Glendale Chard, DO  furosemide (LASIX) 20 MG tablet Take 1 tablet (20 mg total) by mouth daily as needed for edema. 12/01/22   Glendale Chard, DO  lidocaine  (HM LIDOCAINE PATCH) 4 % Place 1 patch onto the skin daily. 09/27/22   Glendale Chard, DO  pantoprazole (PROTONIX) 40 MG tablet TAKE 1 TABLET BY MOUTH EVERY DAY 12/22/22   Glendale Chard, DO      Allergies    Vicodin [hydrocodone-acetaminophen] and Bc fast pain [aspirin-salicylamide-caffeine]    Review of Systems   Review of Systems  Constitutional:  Positive for fatigue. Negative for fever.  HENT:  Positive for congestion and sore throat.   Respiratory:  Positive for cough. Negative for wheezing.   Cardiovascular:  Negative for chest pain.  Musculoskeletal:  Positive for neck pain.  Neurological:  Negative for headaches.    Physical Exam Updated Vital Signs BP 135/74   Pulse 69   Temp 99.3 F (37.4 C) (Oral)   Resp 18   SpO2 97%  Physical Exam Constitutional:      Appearance: She is well-developed.  HENT:     Head: Normocephalic and atraumatic.     Right Ear: Tympanic membrane normal.     Left Ear: Tympanic membrane normal.     Nose: No congestion or rhinorrhea.     Mouth/Throat:     Mouth: Mucous membranes are moist.     Pharynx: Oropharynx is clear. No oropharyngeal exudate or posterior oropharyngeal erythema.     Tonsils: No tonsillar exudate or tonsillar abscesses.  Eyes:     Conjunctiva/sclera: Conjunctivae normal.  Cardiovascular:     Rate and Rhythm: Normal rate and regular rhythm.  Pulmonary:     Effort: Pulmonary effort is normal.     Breath sounds: Normal breath sounds. No wheezing or rhonchi.  Musculoskeletal:     Cervical back: Neck supple. No rigidity or tenderness.  Lymphadenopathy:     Cervical: Cervical adenopathy present.  Skin:    General: Skin is warm and dry.  Neurological:     General: No focal deficit present.     Mental Status: She is alert.     GCS: GCS eye subscore is 4. GCS verbal subscore is 5. GCS motor subscore is 6.     ED Results / Procedures / Treatments   Labs (all labs ordered are listed, but only abnormal results are  displayed) Labs Reviewed  GROUP A STREP BY PCR  SARS CORONAVIRUS 2 BY RT PCR    EKG None  Radiology No results found.  Procedures Procedures    Medications Ordered in ED Medications - No data to display  ED Course/ Medical Decision Making/ A&P                                 Medical Decision Making Risk Prescription drug management.   This patient complains of sore throat dry cough; this involves an extensive number of treatment Options and is a complaint that carries with it a high risk of complications and morbidity. The differential includes strep throat, pharyngitis, viral syndrome, COVID  I ordered, reviewed and interpreted labs, which included strep and COVID-negative Additional history obtained from patient's daughter Previous records obtained and reviewed in epic including recent PCP notes Social determinants considered, no significant barriers Critical Interventions: None  After the interventions stated above, I reevaluated the patient and found patient is well-appearing and nontoxic Admission and further testing considered, no indications for admission or further workup at this time.  Will cover with antibiotics and recommended other symptomatic treatment.  Return instructions discussed         Final Clinical Impression(s) / ED Diagnoses Final diagnoses:  Pharyngitis, unspecified etiology    Rx / DC Orders ED Discharge Orders          Ordered    amoxicillin (AMOXIL) 500 MG capsule  3 times daily        03/20/23 1334              Terrilee Files, MD 03/20/23 1525

## 2023-03-21 ENCOUNTER — Other Ambulatory Visit: Payer: Self-pay | Admitting: Student

## 2023-03-21 DIAGNOSIS — R1319 Other dysphagia: Secondary | ICD-10-CM

## 2023-04-14 ENCOUNTER — Ambulatory Visit (INDEPENDENT_AMBULATORY_CARE_PROVIDER_SITE_OTHER): Payer: Medicare HMO | Admitting: Audiology

## 2023-04-14 ENCOUNTER — Encounter (INDEPENDENT_AMBULATORY_CARE_PROVIDER_SITE_OTHER): Payer: Self-pay

## 2023-04-14 ENCOUNTER — Ambulatory Visit (INDEPENDENT_AMBULATORY_CARE_PROVIDER_SITE_OTHER): Payer: Medicare HMO | Admitting: Otolaryngology

## 2023-04-14 VITALS — BP 142/82 | HR 75 | Ht 66.0 in | Wt 215.0 lb

## 2023-04-14 DIAGNOSIS — H903 Sensorineural hearing loss, bilateral: Secondary | ICD-10-CM

## 2023-04-14 DIAGNOSIS — H8001 Otosclerosis involving oval window, nonobliterative, right ear: Secondary | ICD-10-CM

## 2023-04-16 DIAGNOSIS — H8001 Otosclerosis involving oval window, nonobliterative, right ear: Secondary | ICD-10-CM | POA: Insufficient documentation

## 2023-04-16 DIAGNOSIS — H903 Sensorineural hearing loss, bilateral: Secondary | ICD-10-CM | POA: Insufficient documentation

## 2023-04-16 NOTE — Progress Notes (Signed)
Patient ID: [name], female   DOB: [birth year], 67 y.o.   MRN: [id number]  CC: Progressive hearing loss  HPI:  [name] is a 67 y.o. female who presents today complaining of progressive hearing loss for several years.  She has never worn hearing aids.  She has no recent otitis media or otitis externa.  She has no previous otologic surgery.  She recently underwent an audiological evaluation at [facility] health, which found bilateral high-frequency sensorineural hearing loss and asymmetric right ear conductive hearing loss.  Currently the patient denies any otalgia, otorrhea, or vertigo.  Past Medical History:  Diagnosis Date   Anxiety     Past Surgical History:  Procedure Laterality Date   ABDOMINAL HYSTERECTOMY     GAS/FLUID EXCHANGE Right 01/05/2016   Procedure: GAS/FLUID EXCHANGE;  Surgeon: Stephannie Li, MD;  Location: [facility] OR;  Service: Ophthalmology;  Laterality: Right;   INJECTION OF SILICONE OIL Right 02/19/2016   Procedure: INJECTION OF SILICONE OIL IN RIGHT EYE;  Surgeon: Stephannie Li, MD;  Location: [facility] OR;  Service: Ophthalmology;  Laterality: Right;   LASER PHOTO ABLATION Right 01/05/2016   Procedure: LASER PHOTO ABLATION;  Surgeon: Stephannie Li, MD;  Location: [facility] OR;  Service: Ophthalmology;  Laterality: Right;   LASER PHOTO ABLATION Right 02/19/2016   Procedure: LASER PHOTO ABLATION OF RIGHT EYE;  Surgeon: Stephannie Li, MD;  Location[facility] OR;  Service: Ophthalmology;  Laterality: Right;   PARS PLANA VITRECTOMY Right 01/05/2016   Procedure: PARS PLANA VITRECTOMY WITH 25 GAUGE;  Surgeon: Stephannie Li, MD;  Location: [facility] OR;  Service: Ophthalmology;  Laterality: Right;   PARS PLANA VITRECTOMY Right 05/31/2016   Procedure: PARS PLANA VITRECTOMY WITH 25 GAUGE;  Surgeon: Stephannie Li, MD;  Location: [facility] OR;  Service: Ophthalmology;  Laterality: Right;  Vitrectomy , AC washout, removal of silicone oil right eye   SCLERAL BUCKLE Right 12/18/2015   Procedure: SCLERAL BUCKLE with CRYO  and GAS BUBBLE to RIGHT EYE;  Surgeon: Stephannie Li, MD;  Location: [facility] OR;  Service: Ophthalmology;  Laterality: Right;   TONSILLECTOMY      Family History  Problem Relation Age of Onset   Diabetes Mother    Heart failure Father    Diabetes Father    Lung cancer Neg Hx    Autoimmune disease Neg Hx     Social History:  reports that she quit smoking about 18 years ago. Her smoking use included cigarettes. She started smoking about 58 years ago. She has a 20 pack-year smoking history. She has never used smokeless tobacco. She reports that she does not drink alcohol and does not use drugs.  Allergies:  Allergies  Allergen Reactions   Vicodin [Hydrocodone-Acetaminophen] Anaphylaxis and Swelling   Bc Fast Pain [Aspirin-Salicylamide-Caffeine] Other (See Comments)    "messed up stomach and throat"    Prior to Admission medications   Medication Sig Start Date End Date Taking? Authorizing Provider  aspirin-acetaminophen-caffeine (EXCEDRIN MIGRAINE) [phone] MG tablet Take 1 tablet by mouth every 6 (six) hours as needed for headache.   Yes [provider]  atorvastatin (LIPITOR) 20 MG tablet Take 1 tablet (20 mg total) by mouth daily. 11/01/22  Yes Glendale Chard, DO  calcium carbonate (TUMS EX) 750 MG chewable tablet Chew 1 tablet by mouth 2 (two) times daily as needed for heartburn.   Yes [provider]  diclofenac Sodium (VOLTAREN) 1 % GEL Apply 4 g topically 4 (four) times daily. 09/27/22  Yes Glendale Chard, DO  Elastic  Bandages & Supports (MEDICAL COMPRESSION SOCKS) MISC 1 packet by Does not apply route daily. 12/01/22  Yes Glendale Chard, DO  furosemide (LASIX) 20 MG tablet Take 1 tablet (20 mg total) by mouth daily as needed for edema. 12/01/22  Yes Glendale Chard, DO  lidocaine (HM LIDOCAINE PATCH) 4 % Place 1 patch onto the skin daily. 09/27/22  Yes Glendale Chard, DO  pantoprazole (PROTONIX) 40 MG tablet TAKE 1 TABLET BY MOUTH EVERY DAY 03/21/23  Yes Celine Mans, MD   amoxicillin (AMOXIL) 500 MG capsule Take 1 capsule (500 mg total) by mouth 3 (three) times daily. Patient not taking: Reported on 04/14/2023 03/20/23   Terrilee Files, MD    Blood pressure (!) 142/82, pulse 75, height 5\' 6"  (1.676 m), weight 215 lb (97.5 kg), SpO2 96%. Exam: General: Communicates without difficulty, well nourished, no acute distress. Head: Normocephalic, no evidence injury, no tenderness, facial buttresses intact without stepoff. Face/sinus: No tenderness to palpation and percussion. Facial movement is normal and symmetric. Eyes: PERRL, EOMI. No scleral icterus, conjunctivae clear. Neuro: CN II exam reveals vision grossly intact.  No nystagmus at any point of gaze. Ears: Auricles well formed without lesions.  Ear canals are intact without mass or lesion.  No erythema or edema is appreciated.  The TMs are intact without fluid. Nose: External evaluation reveals normal support and skin without lesions.  Dorsum is intact.  Anterior rhinoscopy reveals congested mucosa over anterior aspect of inferior turbinates and intact septum.  No purulence noted. Oral:  Oral cavity and oropharynx are intact, symmetric, without erythema or edema.  Mucosa is moist without lesions. Neck: Full range of motion without pain.  There is no significant lymphadenopathy.  No masses palpable.  Thyroid bed within normal limits to palpation.  Parotid glands and submandibular glands equal bilaterally without mass.  Trachea is midline. Neuro:  CN 2-12 grossly intact.   Assessment: 1.  Bilateral high-frequency sensorineural hearing loss, likely secondary to routine presbycusis. 2.  The patient also has asymmetric right ear conductive hearing loss.  This is likely secondary to otosclerosis. 3.  Her ear canals, tympanic membranes, and middle ear spaces are all normal.  No middle ear effusion is noted.  Plan: 1.  The physical exam findings are reviewed with the patient. 2.  She is reassured that no middle ear effusion  or infection is noted today. 3.  The treatment options are discussed.  Options include conservative observation, hearing amplification, or surgical intervention with right ear stapedectomy.  The patient is not interested in any surgical intervention. 4.  The patient will return to [facility] audiology department for hearing aid fitting. 5.  The patient will return for reevaluation in 1 year.  [name] W Dominico Rod 04/16/2023, 3:37 PM

## 2023-06-16 ENCOUNTER — Other Ambulatory Visit: Payer: Self-pay | Admitting: Family Medicine

## 2023-06-16 DIAGNOSIS — R1319 Other dysphagia: Secondary | ICD-10-CM

## 2023-06-24 ENCOUNTER — Other Ambulatory Visit: Payer: Self-pay | Admitting: Student

## 2023-06-24 DIAGNOSIS — R600 Localized edema: Secondary | ICD-10-CM

## 2023-06-29 DIAGNOSIS — H04123 Dry eye syndrome of bilateral lacrimal glands: Secondary | ICD-10-CM | POA: Diagnosis not present

## 2023-06-29 DIAGNOSIS — H43812 Vitreous degeneration, left eye: Secondary | ICD-10-CM | POA: Diagnosis not present

## 2023-06-29 DIAGNOSIS — H35432 Paving stone degeneration of retina, left eye: Secondary | ICD-10-CM | POA: Diagnosis not present

## 2023-06-29 DIAGNOSIS — H35373 Puckering of macula, bilateral: Secondary | ICD-10-CM | POA: Diagnosis not present

## 2023-06-29 DIAGNOSIS — H59811 Chorioretinal scars after surgery for detachment, right eye: Secondary | ICD-10-CM | POA: Diagnosis not present

## 2023-06-29 DIAGNOSIS — H2513 Age-related nuclear cataract, bilateral: Secondary | ICD-10-CM | POA: Diagnosis not present

## 2023-09-22 ENCOUNTER — Encounter: Payer: Self-pay | Admitting: Student

## 2023-09-23 ENCOUNTER — Ambulatory Visit: Admitting: Student

## 2023-09-23 VITALS — BP 124/79 | HR 78 | Ht 66.0 in | Wt 245.6 lb

## 2023-09-23 DIAGNOSIS — R600 Localized edema: Secondary | ICD-10-CM

## 2023-09-23 NOTE — Patient Instructions (Addendum)
 Ms. Billiot,  It is great to meet you! I am going to check some labs today to try and zero in on the cause of your swelling.  I am also going to place a referral to physical therapy as they may be able to help you with exercise tolerance and some compressive therapy to help with the swelling in your legs.  If you decide that you would like us  to inject your trigger finger, please just make an appointment to come back and we can take care of that here in clinic.  Alexa Andrews, MD

## 2023-09-23 NOTE — Progress Notes (Signed)
    SUBJECTIVE:   CHIEF COMPLAINT / HPI:   Acute on Chronic LE Edema Worsening.  This has been an ongoing issue for over a year now.  She has previously been evaluated by Dr. Annabell Key and was started on Lasix , initially at 20 mg daily, now requiring 40mg  Lasix  daily.  Dr. Annabell Key ordered an echocardiogram which demonstrated elevated pulmonary artery pressures.  Was subsequently seen by pulm. Per Dr. Rayne Callas note: "We discussed that the ultimate test for pulmonary hypertension is a right heart catheterization.  She has no family or personal history of autoimmune disease. She has no dyspnea. No personal or family history of VTE. The likelihood that this is group 1 PAH requiring anything beyond diuretics is fairly low given age, exam, symptoms, absence of co-morbid conditions.  The presence of lower extremity edema suggests that this is not isolated right sided heart failure." No recent BNP.  She reports that the swelling that she has in both of her legs is so bad that it affects her ability to stay active, though she knows that exercise is an important part of treating lower extremity edema.  This is frustrating for her.  She does endorse good adherence to her compression stockings and is not in fact wearing them today. She does have a history of abdominal surgeries with hysterectomy, though this was several years before the onset of present symptoms.  She has no known history of liver kidney disease.  She is not having any shortness of breath associated.  OBJECTIVE:   BP 124/79   Pulse 78   Ht 5\' 6"  (1.676 m)   Wt 245 lb 9.6 oz (111.4 kg)   SpO2 97%   BMI 39.64 kg/m   Gen: Well appearing and NAD  Cardio: RRR Pulm: Normal WOB on RA, lungs clear to auscultation throughout, speaking in full sentences  Ext: Symmetric edema to both legs. 40.5cm circumference of both calves. Chronic skin changes c/w longstanding venous stasis. Curiously has +Stemmer sign on Left but - on Right.   ASSESSMENT/PLAN:    Assessment & Plan Bilateral edema of lower extremity Exact etiology remains a bit unclear, though leading item on the differential at this point is chronic venous insufficiency.  Cannot entirely rule out lymphedema with the positive Stemmer sign on the left and her history of abdominal surgery.  Would certainly benefit from a PT evaluation for compressive therapy and to assist with exercise programming and tolerance. -Lab evaluation today with CBC, CMP, mag, TSH -Referred to physical therapy for edema clinic, compressive therapy - Continue daily use of compression stockings, elevating as able, and exercising as much as she can tolerate     J Lark Plum, MD [facility]

## 2023-09-24 LAB — CBC
Hematocrit: 41.2 % (ref 34.0–46.6)
Hemoglobin: 13.3 g/dL (ref 11.1–15.9)
MCH: 29.5 pg (ref 26.6–33.0)
MCHC: 32.3 g/dL (ref 31.5–35.7)
MCV: 91 fL (ref 79–97)
Platelets: 283 10*3/uL (ref 150–450)
RBC: 4.51 x10E6/uL (ref 3.77–5.28)
RDW: 12.8 % (ref 11.7–15.4)
WBC: 7.2 10*3/uL (ref 3.4–10.8)

## 2023-09-24 LAB — COMPREHENSIVE METABOLIC PANEL WITH GFR
ALT: 19 IU/L (ref 0–32)
AST: 17 IU/L (ref 0–40)
Albumin: 4.3 g/dL (ref 3.9–4.9)
Alkaline Phosphatase: 135 IU/L — ABNORMAL HIGH (ref 44–121)
BUN/Creatinine Ratio: 18 (ref 12–28)
BUN: 18 mg/dL (ref 8–27)
Bilirubin Total: 0.3 mg/dL (ref 0.0–1.2)
CO2: 24 mmol/L (ref 20–29)
Calcium: 10.6 mg/dL — ABNORMAL HIGH (ref 8.7–10.3)
Chloride: 101 mmol/L (ref 96–106)
Creatinine, Ser: 1 mg/dL (ref 0.57–1.00)
Globulin, Total: 2.5 g/dL (ref 1.5–4.5)
Glucose: 88 mg/dL (ref 70–99)
Potassium: 4.1 mmol/L (ref 3.5–5.2)
Sodium: 142 mmol/L (ref 134–144)
Total Protein: 6.8 g/dL (ref 6.0–8.5)
eGFR: 61 mL/min/{1.73_m2} (ref >59)

## 2023-09-24 LAB — MAGNESIUM: Magnesium: 1.9 mg/dL (ref 1.6–2.3)

## 2023-09-24 LAB — TSH RFX ON ABNORMAL TO FREE T4: TSH: 7.31 u[IU]/mL — ABNORMAL HIGH (ref 0.450–4.500)

## 2023-09-24 LAB — T4F: T4,Free (Direct): 0.87 ng/dL (ref 0.82–1.77)

## 2023-09-24 NOTE — Assessment & Plan Note (Signed)
 Exact etiology remains a bit unclear, though leading item on the differential at this point is chronic venous insufficiency.  Cannot entirely rule out lymphedema with the positive Stemmer sign on the left and her history of abdominal surgery.  Would certainly benefit from a PT evaluation for compressive therapy and to assist with exercise programming and tolerance. -Lab evaluation today with CBC, CMP, mag, TSH -Referred to physical therapy for edema clinic, compressive therapy - Continue daily use of compression stockings, elevating as able, and exercising as much as she can tolerate

## 2023-09-25 ENCOUNTER — Other Ambulatory Visit: Payer: Self-pay | Admitting: Family Medicine

## 2023-09-25 DIAGNOSIS — R1319 Other dysphagia: Secondary | ICD-10-CM

## 2023-09-28 ENCOUNTER — Ambulatory Visit: Payer: Self-pay | Admitting: Student

## 2023-10-04 NOTE — Telephone Encounter (Signed)
 Patient scheduled for 6/19 for lab only visit.

## 2023-10-06 ENCOUNTER — Other Ambulatory Visit

## 2023-10-08 LAB — PTH, INTACT AND CALCIUM
Calcium: 9.8 mg/dL (ref 8.7–10.3)
PTH: 41 pg/mL (ref 15–65)

## 2023-10-17 ENCOUNTER — Telehealth: Payer: Self-pay

## 2023-10-17 NOTE — Telephone Encounter (Signed)
 Patient's daughter calls nurse line regarding results from 10/06/23.  She wanted to receive update regarding these results.   Please advise.   Chiquita JAYSON English, RN

## 2023-10-17 NOTE — Telephone Encounter (Signed)
 Called to discuss. No answer, LVM. If calls back, can let them know labs were normal. No need for further workup or intervention.

## 2023-10-21 ENCOUNTER — Other Ambulatory Visit: Payer: Self-pay | Admitting: Student

## 2023-10-21 DIAGNOSIS — R600 Localized edema: Secondary | ICD-10-CM

## 2023-10-25 ENCOUNTER — Other Ambulatory Visit: Payer: Self-pay | Admitting: Student

## 2023-10-25 DIAGNOSIS — E782 Mixed hyperlipidemia: Secondary | ICD-10-CM

## 2023-10-25 NOTE — Therapy (Signed)
 OUTPATIENT PHYSICAL THERAPY LOWER EXTREMITY EVALUATION   Patient Name: [name] MRN: [id number] DOB:1956/02/24, 68 y.o., female Today's Date: 10/26/2023  END OF SESSION:  PT End of Session - 10/26/23 1310     Visit Number 1    Date for PT Re-Evaluation 01/04/24    Authorization Type [employer]    PT Start Time 1315    PT Stop Time 1355    PT Time Calculation (min) 40 min          Past Medical History:  Diagnosis Date   Anxiety    Past Surgical History:  Procedure Laterality Date   ABDOMINAL HYSTERECTOMY     GAS/FLUID EXCHANGE Right 01/05/2016   Procedure: GAS/FLUID EXCHANGE;  Surgeon: Selinda Slocumb, MD;  Location: [facility] OR;  Service: Ophthalmology;  Laterality: Right;   INJECTION OF SILICONE OIL Right 02/19/2016   Procedure: INJECTION OF SILICONE OIL IN RIGHT EYE;  Surgeon: Selinda Slocumb, MD;  Location: [facility] OR;  Service: Ophthalmology;  Laterality: Right;   LASER PHOTO ABLATION Right 01/05/2016   Procedure: LASER PHOTO ABLATION;  Surgeon: Selinda Slocumb, MD;  Location: [facility] OR;  Service: Ophthalmology;  Laterality: Right;   LASER PHOTO ABLATION Right 02/19/2016   Procedure: LASER PHOTO ABLATION OF RIGHT EYE;  Surgeon: Selinda Slocumb, MD;  Location: [facility] OR;  Service: Ophthalmology;  Laterality: Right;   PARS PLANA VITRECTOMY Right 01/05/2016   Procedure: PARS PLANA VITRECTOMY WITH 25 GAUGE;  Surgeon: Selinda Slocumb, MD;  Location: [facility] OR;  Service: Ophthalmology;  Laterality: Right;   PARS PLANA VITRECTOMY Right 05/31/2016   Procedure: PARS PLANA VITRECTOMY WITH 25 GAUGE;  Surgeon: Selinda Slocumb, MD;  Location: [facility] OR;  Service: Ophthalmology;  Laterality: Right;  Vitrectomy , AC washout, removal of silicone oil right eye   SCLERAL BUCKLE Right 12/18/2015   Procedure: SCLERAL BUCKLE with CRYO and GAS BUBBLE to RIGHT EYE;  Surgeon: Selinda Slocumb, MD;  Location: [facility] OR;  Service: Ophthalmology;  Laterality: Right;   TONSILLECTOMY     Patient Active Problem List   Diagnosis Date Noted    Sensory hearing loss, bilateral 04/16/2023   Nonobliterative otosclerosis involving oval window, right 04/16/2023   Hyperlipidemia 11/01/2022   Chronic back pain 09/27/2022   Bilateral edema of lower extremity 08/23/2022   Esophageal dysphagia 08/23/2022   HOH (hard of hearing) 08/23/2022    PCP: Damien Pinal  REFERRING PROVIDER: Krystal McDiarmid  REFERRING DIAG:  R60.0 (ICD-10-CM) - Bilateral edema of lower extremity    THERAPY DIAG:  No diagnosis found.  Rationale for Evaluation and Treatment: Rehabilitation  ONSET DATE: 09/23/23  SUBJECTIVE:   SUBJECTIVE STATEMENT: My feet, ankle, and legs hurt. If I stand for too long it starts to hurt. I have swelling in both of my legs but the doctor does not know why.   PERTINENT HISTORY: This has been an ongoing issue for over a year now.  She has previously been evaluated by Dr. Pinal and was started on Lasix , initially at 20 mg daily, now requiring 40mg  Lasix  daily.  Dr. Pinal ordered an echocardiogram which demonstrated elevated pulmonary artery pressures.  Was subsequently seen by pulm. Per Dr. Correne note: We discussed that the ultimate test for pulmonary hypertension is a right heart catheterization  She has no family or personal history of autoimmune disease. She has no dyspnea. No personal or family history of VTE. The likelihood that this is group 1 PAH requiring anything beyond diuretics is fairly low given age, exam, symptoms, absence of co-morbid conditions.  The presence of lower extremity edema suggests that this is not isolated right sided heart failure. No recent BNP.  She reports that the swelling that she has in both of her legs is so bad that it affects her ability to stay active, though she knows that exercise is an important part of treating lower extremity edema.  This is frustrating for her.  She does endorse good adherence to her compression stockings and is not in fact wearing them today. She does have a history  of abdominal surgeries with hysterectomy, though this was several years before the onset of present symptoms.  She has no known history of liver kidney disease. She is not having any shortness of breath associated.  PAIN:  Are you having pain? Yes: NPRS scale: it can get up to a 10 Pain location: both legs below knees, into ankles and feet Pain description: cramping, burning, stabbing  Aggravating factors: standing, sitting, walking Relieving factors: nothing yet  PRECAUTIONS: None  RED FLAGS: None   WEIGHT BEARING RESTRICTIONS: No  FALLS:  Has patient fallen in last 6 months? No  LIVING ENVIRONMENT: Lives with: lives with their daughter Lives in: House/apartment Stairs: Yes: External: 6 steps; can reach both Has following equipment at home: None  OCCUPATION: not working  PLOF: Independent and Independent with basic ADLs  PATIENT GOALS: get rid of pain   NEXT MD VISIT:   OBJECTIVE:  Note: Objective measures were completed at Evaluation unless otherwise noted.  DIAGNOSTIC FINDINGS:   COGNITION: Overall cognitive status: Within functional limits for tasks assessed     SENSATION: WFL  EDEMA:  Swelling in bilateral lower extremities   MUSCLE LENGTH: Hamstrings: tightness in bilateral LE  POSTURE: rounded shoulders  PALPATION: Some pitting edema in lower legs   LOWER EXTREMITY ROM: Overall WFL, swelling in ankles limites DF/PF some   LOWER EXTREMITY MMT: 5/5 bilaterally   FUNCTIONAL TESTS:  5 times sit to stand: 17s from chair height  Timed up and go (TUG): 19.64s   GAIT: Distance walked: in clinic distances Assistive device utilized: None Level of assistance: Complete Independence Comments: slowed cadence and gait, antalgic                                                                                                                                TREATMENT DATE: 10/26/23 EVAL    PATIENT EDUCATION:  Education details: POC and HEP, importance of  elevating and wearing compression stockings Person educated: Patient and Child(ren) Education method: Explanation and Demonstration Education comprehension: verbalized understanding and returned demonstration  HOME EXERCISE PROGRAM: Access Code: [email or web address] URL: [email or web address] Date: 10/26/2023 Prepared by: Almetta Fam  Exercises - Supine Ankle Pumps  - 1 x daily - 7 x weekly - 3 sets - 10 reps - Ankle Pumps in Elevation  - 1 x daily - 7 x weekly - 3 sets - 10 reps - Supine Active Straight Leg Raise  - 1 x  daily - 7 x weekly - 2 sets - 10 reps - Supine Lower Trunk Rotation  - 1 x daily - 7 x weekly - 2 sets - 10 reps - Sit to Stand  - 1 x daily - 7 x weekly - 2 sets - 10 reps  Elevating for 30 mins at a time a few times a day  ASSESSMENT:  CLINICAL IMPRESSION: Patient is a 68 y.o. female who was seen today for physical therapy evaluation and treatment for lower extremity edema and pain. She has been to multiple doctors and they said her lung and heart functions were good and normal. She reports pain in bilateral lower legs. She has compression stockings but feels that they increase her swelling and pain. Patient has increased cramping with some of the exercises we tried to do today, mostly in her hip flexor and hamstring. She is on a diuretic and was told to decrease water  intake. We reviewed some supine exercises to work on decreasing swelling and working on some gentle mobility in the low back. Patient will benefit from skilled PT to address her lower extremity edema and pain to be able to start an exercise program and complete household chores without difficulty.  OBJECTIVE IMPAIRMENTS: Abnormal gait, decreased activity tolerance, decreased balance, decreased endurance, difficulty walking, increased edema, improper body mechanics, postural dysfunction, and pain.   ACTIVITY LIMITATIONS: lifting, sitting, standing, squatting, stairs, transfers, and locomotion  level  PARTICIPATION LIMITATIONS: cleaning, laundry, shopping, community activity, and yard work  PERSONAL FACTORS: Age and Past/current experiences are also affecting patient's functional outcome.   REHAB POTENTIAL: Good  CLINICAL DECISION MAKING: Evolving/moderate complexity  EVALUATION COMPLEXITY: Low   GOALS: Goals reviewed with patient? Yes  SHORT TERM GOALS: Target date: 11/30/23  Patient will be independent with initial HEP. Baseline:  Goal status: INITIAL  2.  Patient will demonstrate 5xSTS <15s from chair height without pain in legs  Baseline: 17s burning in legs Goal status: INITIAL   LONG TERM GOALS: Target date: 01/04/24  Patient will be independent with advanced/ongoing HEP to improve outcomes and carryover.  Baseline:  Goal status: INITIAL  2.  Patient will report 50-75% improvement with lower extremity pain Baseline: 10/10 at worst Goal status: INITIAL  3. Patient will demonstrate decreased fall risk by scoring < 14 sec on TUG. Baseline: 19.64s Goal status: INITIAL  4.  Patient will have 50% decrease in lower extremity edema  Baseline: bilateral swelling Goal status: INITIAL  PLAN:  PT FREQUENCY: 1-2x/week  PT DURATION: 10 weeks  PLANNED INTERVENTIONS: 97110-Therapeutic exercises, 97530- Therapeutic activity, 97112- Neuromuscular re-education, 97535- Self Care, [id number]- Manual therapy, [phone]- Gait training, [phone]- Vasopneumatic device, Patient/Family education, Balance training, Stair training, Taping, Joint mobilization, Spinal mobilization, Manual lymph drainage, Compression bandaging, Cryotherapy, and Moist heat  PLAN FOR NEXT SESSION: supine exercises to help decrease swelling and edema, how is HEP? Stretching as tolerated    Almetta Fam, PT 10/26/2023, 2:04 PM

## 2023-10-26 ENCOUNTER — Ambulatory Visit: Attending: Family Medicine

## 2023-10-26 DIAGNOSIS — M79662 Pain in left lower leg: Secondary | ICD-10-CM | POA: Insufficient documentation

## 2023-10-26 DIAGNOSIS — R600 Localized edema: Secondary | ICD-10-CM | POA: Diagnosis not present

## 2023-10-26 DIAGNOSIS — M79661 Pain in right lower leg: Secondary | ICD-10-CM | POA: Diagnosis not present

## 2023-11-15 ENCOUNTER — Encounter: Payer: Self-pay | Admitting: Physical Therapy

## 2023-11-15 ENCOUNTER — Ambulatory Visit: Admitting: Physical Therapy

## 2023-11-15 DIAGNOSIS — R600 Localized edema: Secondary | ICD-10-CM

## 2023-11-15 DIAGNOSIS — M79661 Pain in right lower leg: Secondary | ICD-10-CM | POA: Diagnosis not present

## 2023-11-15 DIAGNOSIS — M79662 Pain in left lower leg: Secondary | ICD-10-CM | POA: Diagnosis not present

## 2023-11-15 NOTE — Therapy (Signed)
 OUTPATIENT PHYSICAL THERAPY LOWER EXTREMITY EVALUATION   Patient Name: [name] MRN: [id number] DOB:1955/12/20, 68 y.o., female Today's Date: 11/15/2023  END OF SESSION:  PT End of Session - 11/15/23 1601     Visit Number 2    Date for PT Re-Evaluation 01/04/24    PT Start Time 1600    PT Stop Time 1645    PT Time Calculation (min) 45 min    Behavior During Therapy [facility] for tasks assessed/performed          Past Medical History:  Diagnosis Date   Anxiety    Past Surgical History:  Procedure Laterality Date   ABDOMINAL HYSTERECTOMY     GAS/FLUID EXCHANGE Right 01/05/2016   Procedure: GAS/FLUID EXCHANGE;  Surgeon: Selinda Slocumb, MD;  Location: [facility] OR;  Service: Ophthalmology;  Laterality: Right;   INJECTION OF SILICONE OIL Right 02/19/2016   Procedure: INJECTION OF SILICONE OIL IN RIGHT EYE;  Surgeon: Selinda Slocumb, MD;  Location: [facility] OR;  Service: Ophthalmology;  Laterality: Right;   LASER PHOTO ABLATION Right 01/05/2016   Procedure: LASER PHOTO ABLATION;  Surgeon: Selinda Slocumb, MD;  Location: [facility] OR;  Service: Ophthalmology;  Laterality: Right;   LASER PHOTO ABLATION Right 02/19/2016   Procedure: LASER PHOTO ABLATION OF RIGHT EYE;  Surgeon: Selinda Slocumb, MD;  Location: [facility] OR;  Service: Ophthalmology;  Laterality: Right;   PARS PLANA VITRECTOMY Right 01/05/2016   Procedure: PARS PLANA VITRECTOMY WITH 25 GAUGE;  Surgeon: Selinda Slocumb, MD;  Location: [facility] OR;  Service: Ophthalmology;  Laterality: Right;   PARS PLANA VITRECTOMY Right 05/31/2016   Procedure: PARS PLANA VITRECTOMY WITH 25 GAUGE;  Surgeon: Selinda Slocumb, MD;  Location: [facility] OR;  Service: Ophthalmology;  Laterality: Right;  Vitrectomy , AC washout, removal of silicone oil right eye   SCLERAL BUCKLE Right 12/18/2015   Procedure: SCLERAL BUCKLE with CRYO and GAS BUBBLE to RIGHT EYE;  Surgeon: Selinda Slocumb, MD;  Location: [facility] OR;  Service: Ophthalmology;  Laterality: Right;   TONSILLECTOMY     Patient Active Problem List    Diagnosis Date Noted   Sensory hearing loss, bilateral 04/16/2023   Nonobliterative otosclerosis involving oval window, right 04/16/2023   Hyperlipidemia 11/01/2022   Chronic back pain 09/27/2022   Bilateral edema of lower extremity 08/23/2022   Esophageal dysphagia 08/23/2022   HOH (hard of hearing) 08/23/2022    PCP: Damien Pinal  REFERRING PROVIDER: Krystal McDiarmid  REFERRING DIAG:  R60.0 (ICD-10-CM) - Bilateral edema of lower extremity    THERAPY DIAG:  Localized edema  Pain in both lower legs  Rationale for Evaluation and Treatment: Rehabilitation  ONSET DATE: 09/23/23  SUBJECTIVE:   SUBJECTIVE STATEMENT: Doing ok, today is a better day, ankles are more swollen   PERTINENT HISTORY: This has been an ongoing issue for over a year now.  She has previously been evaluated by Dr. Pinal and was started on Lasix , initially at 20 mg daily, now requiring 40mg  Lasix  daily.  Dr. Pinal ordered an echocardiogram which demonstrated elevated pulmonary artery pressures.  Was subsequently seen by pulm. Per Dr. Correne note: We discussed that the ultimate test for pulmonary hypertension is a right heart catheterization  She has no family or personal history of autoimmune disease. She has no dyspnea. No personal or family history of VTE. The likelihood that this is group 1 PAH requiring anything beyond diuretics is fairly low given age, exam, symptoms, absence of co-morbid conditions.  The presence of lower extremity edema suggests that this is  not isolated right sided heart failure. No recent BNP.  She reports that the swelling that she has in both of her legs is so bad that it affects her ability to stay active, though she knows that exercise is an important part of treating lower extremity edema.  This is frustrating for her.  She does endorse good adherence to her compression stockings and is not in fact wearing them today. She does have a history of abdominal surgeries with  hysterectomy, though this was several years before the onset of present symptoms.  She has no known history of liver kidney disease. She is not having any shortness of breath associated.  PAIN:  Are you having pain? Yes: NPRS scale: 0/10 Pain location: both legs below knees, into ankles and feet Pain description: cramping, burning, stabbing  Aggravating factors: standing, sitting, walking Relieving factors: nothing yet  PRECAUTIONS: None  RED FLAGS: None   WEIGHT BEARING RESTRICTIONS: No  FALLS:  Has patient fallen in last 6 months? No  LIVING ENVIRONMENT: Lives with: lives with their daughter Lives in: House/apartment Stairs: Yes: External: 6 steps; can reach both Has following equipment at home: None  OCCUPATION: not working  PLOF: Independent and Independent with basic ADLs  PATIENT GOALS: get rid of pain   NEXT MD VISIT:   OBJECTIVE:  Note: Objective measures were completed at Evaluation unless otherwise noted.  DIAGNOSTIC FINDINGS:   COGNITION: Overall cognitive status: Within functional limits for tasks assessed     SENSATION: WFL  EDEMA:  Swelling in bilateral lower extremities   MUSCLE LENGTH: Hamstrings: tightness in bilateral LE  POSTURE: rounded shoulders  PALPATION: Some pitting edema in lower legs   LOWER EXTREMITY ROM: Overall WFL, swelling in ankles limites DF/PF some   LOWER EXTREMITY MMT: 5/5 bilaterally   FUNCTIONAL TESTS:  5 times sit to stand: 17s from chair height  Timed up and go (TUG): 19.64s   GAIT: Distance walked: in clinic distances Assistive device utilized: None Level of assistance: Complete Independence Comments: slowed cadence and gait, antalgic                                                                                                                                TREATMENT DATE:  11/15/23 NuStep L 5 x 6 min Hs curls 20lb 2x10 Leg Ext 5lb 2x10 S2S 2x10 Ankle PF/ DF green x15 each Hip add ball  squeeze Hip abd w/ Manual resistance 4in step ups 2x5 each  10/26/23 EVAL    PATIENT EDUCATION:  Education details: POC and HEP, importance of elevating and wearing compression stockings Person educated: Patient and Child(ren) Education method: Explanation and Demonstration Education comprehension: verbalized understanding and returned demonstration  HOME EXERCISE PROGRAM: Access Code: [email or web address] URL: [email or web address] Date: 10/26/2023 Prepared by: Almetta Fam  Exercises - Supine Ankle Pumps  - 1 x daily - 7 x weekly - 3 sets - 10 reps - Ankle Pumps in Elevation  -  1 x daily - 7 x weekly - 3 sets - 10 reps - Supine Active Straight Leg Raise  - 1 x daily - 7 x weekly - 2 sets - 10 reps - Supine Lower Trunk Rotation  - 1 x daily - 7 x weekly - 2 sets - 10 reps - Sit to Stand  - 1 x daily - 7 x weekly - 2 sets - 10 reps  Elevating for 30 mins at a time a few times a day  ASSESSMENT:  CLINICAL IMPRESSION: Patient is a 68 y.o. female who was seen today for physical therapy  treatment for lower extremity edema and pain. She has been to multiple doctors and they said her lung and heart functions were good and normal. She enters with minium swelling stating today was one of her good days. Session focused on LE functional strength to aid with swelling via muscular pump and to increase functional capacity. Increase fatigue noted with moore functional interventions. Cues for full ROM needed with curls and ext. Cue not to allow LE to push against table with sit to stands. Patient will benefit from skilled PT to address her lower extremity edema and pain to be able to start an exercise program and complete household chores without difficulty.  OBJECTIVE IMPAIRMENTS: Abnormal gait, decreased activity tolerance, decreased balance, decreased endurance, difficulty walking, increased edema, improper body mechanics, postural dysfunction, and pain.   ACTIVITY LIMITATIONS: lifting,  sitting, standing, squatting, stairs, transfers, and locomotion level  PARTICIPATION LIMITATIONS: cleaning, laundry, shopping, community activity, and yard work  PERSONAL FACTORS: Age and Past/current experiences are also affecting patient's functional outcome.   REHAB POTENTIAL: Good  CLINICAL DECISION MAKING: Evolving/moderate complexity  EVALUATION COMPLEXITY: Low   GOALS: Goals reviewed with patient? Yes  SHORT TERM GOALS: Target date: 11/30/23  Patient will be independent with initial HEP. Baseline:  Goal status: Progressing 11/15/23  2.  Patient will demonstrate 5xSTS <15s from chair height without pain in legs  Baseline: 17s burning in legs Goal status: INITIAL   LONG TERM GOALS: Target date: 01/04/24  Patient will be independent with advanced/ongoing HEP to improve outcomes and carryover.  Baseline:  Goal status: INITIAL  2.  Patient will report 50-75% improvement with lower extremity pain Baseline: 10/10 at worst Goal status: INITIAL  3. Patient will demonstrate decreased fall risk by scoring < 14 sec on TUG. Baseline: 19.64s Goal status: INITIAL  4.  Patient will have 50% decrease in lower extremity edema  Baseline: bilateral swelling Goal status: INITIAL  PLAN:  PT FREQUENCY: 1-2x/week  PT DURATION: 10 weeks  PLANNED INTERVENTIONS: 97110-Therapeutic exercises, 97530- Therapeutic activity, 97112- Neuromuscular re-education, 97535- Self Care, 02859- Manual therapy, [phone]- Gait training, [phone]- Vasopneumatic device, Patient/Family education, Balance training, Stair training, Taping, Joint mobilization, Spinal mobilization, Manual lymph drainage, Compression bandaging, Cryotherapy, and Moist heat  PLAN FOR NEXT SESSION: supine exercises to help decrease swelling and edema, how is HEP? Stretching as tolerated functional interventions   Tanda KANDICE Sorrow, PTA 11/15/2023, 4:01 PM

## 2023-11-17 ENCOUNTER — Ambulatory Visit: Admitting: Physical Therapy

## 2023-11-17 DIAGNOSIS — R600 Localized edema: Secondary | ICD-10-CM

## 2023-11-17 DIAGNOSIS — M79662 Pain in left lower leg: Secondary | ICD-10-CM

## 2023-11-17 DIAGNOSIS — M79661 Pain in right lower leg: Secondary | ICD-10-CM | POA: Diagnosis not present

## 2023-11-17 NOTE — Therapy (Signed)
 OUTPATIENT PHYSICAL THERAPY LOWER EXTREMITY    Patient Name: [name] MRN: [id number] DOB:[birth year], 68 y.o., female Today's Date: 11/17/2023  END OF SESSION:  PT End of Session - 11/17/23 1659     Visit Number 3    Date for PT Re-Evaluation 01/04/24    Authorization Type [employer]    PT Start Time 1700    PT Stop Time 1745    PT Time Calculation (min) 45 min          Past Medical History:  Diagnosis Date   Anxiety    Past Surgical History:  Procedure Laterality Date   ABDOMINAL HYSTERECTOMY     GAS/FLUID EXCHANGE Right 01/05/2016   Procedure: GAS/FLUID EXCHANGE;  Surgeon: Selinda Slocumb, MD;  Location: [facility] OR;  Service: Ophthalmology;  Laterality: Right;   INJECTION OF SILICONE OIL Right 02/19/2016   Procedure: INJECTION OF SILICONE OIL IN RIGHT EYE;  Surgeon: Selinda Slocumb, MD;  Location: [facility] OR;  Service: Ophthalmology;  Laterality: Right;   LASER PHOTO ABLATION Right 01/05/2016   Procedure: LASER PHOTO ABLATION;  Surgeon: Selinda Slocumb, MD;  Location: [facility] OR;  Service: Ophthalmology;  Laterality: Right;   LASER PHOTO ABLATION Right 02/19/2016   Procedure: LASER PHOTO ABLATION OF RIGHT EYE;  Surgeon: Selinda Slocumb, MD;  Location: [facility] OR;  Service: Ophthalmology;  Laterality: Right;   PARS PLANA VITRECTOMY Right 01/05/2016   Procedure: PARS PLANA VITRECTOMY WITH 25 GAUGE;  Surgeon: Selinda Slocumb, MD;  Location: [facility] OR;  Service: Ophthalmology;  Laterality: Right;   PARS PLANA VITRECTOMY Right 05/31/2016   Procedure: PARS PLANA VITRECTOMY WITH 25 GAUGE;  Surgeon: Selinda Slocumb, MD;  Location: [facility] OR;  Service: Ophthalmology;  Laterality: Right;  Vitrectomy , AC washout, removal of silicone oil right eye   SCLERAL BUCKLE Right 12/18/2015   Procedure: SCLERAL BUCKLE with CRYO and GAS BUBBLE to RIGHT EYE;  Surgeon: Selinda Slocumb, MD;  Location: [facility] OR;  Service: Ophthalmology;  Laterality: Right;   TONSILLECTOMY     Patient Active Problem List   Diagnosis Date Noted   Sensory hearing  loss, bilateral 04/16/2023   Nonobliterative otosclerosis involving oval window, right 04/16/2023   Hyperlipidemia 11/01/2022   Chronic back pain 09/27/2022   Bilateral edema of lower extremity 08/23/2022   Esophageal dysphagia 08/23/2022   HOH (hard of hearing) 08/23/2022    PCP: Damien Pinal  REFERRING PROVIDER: Krystal McDiarmid  REFERRING DIAG:  R60.0 (ICD-10-CM) - Bilateral edema of lower extremity    THERAPY DIAG:  Localized edema  Pain in both lower legs  Rationale for Evaluation and Treatment: Rehabilitation  ONSET DATE: 09/23/23  SUBJECTIVE:   SUBJECTIVE STATEMENT: Sore after last session. Swelled after too.arrived today without pain and minimal swelling  PERTINENT HISTORY: This has been an ongoing issue for over a year now.  She has previously been evaluated by Dr. Pinal and was started on Lasix , initially at 20 mg daily, now requiring 40mg  Lasix  daily.  Dr. Pinal ordered an echocardiogram which demonstrated elevated pulmonary artery pressures.  Was subsequently seen by pulm. Per Dr. Correne note: We discussed that the ultimate test for pulmonary hypertension is a right heart catheterization  She has no family or personal history of autoimmune disease. She has no dyspnea. No personal or family history of VTE. The likelihood that this is group 1 PAH requiring anything beyond diuretics is fairly low given age, exam, symptoms, absence of co-morbid conditions.  The presence of lower extremity edema suggests that this is not isolated right  sided heart failure. No recent BNP.  She reports that the swelling that she has in both of her legs is so bad that it affects her ability to stay active, though she knows that exercise is an important part of treating lower extremity edema.  This is frustrating for her.  She does endorse good adherence to her compression stockings and is not in fact wearing them today. She does have a history of abdominal surgeries with hysterectomy,  though this was several years before the onset of present symptoms.  She has no known history of liver kidney disease. She is not having any shortness of breath associated.  PAIN:  Are you having pain? Yes: NPRS scale: 0/10 Pain location: both legs below knees, into ankles and feet Pain description: cramping, burning, stabbing  Aggravating factors: standing, sitting, walking Relieving factors: nothing yet  PRECAUTIONS: None  RED FLAGS: None   WEIGHT BEARING RESTRICTIONS: No  FALLS:  Has patient fallen in last 6 months? No  LIVING ENVIRONMENT: Lives with: lives with their daughter Lives in: House/apartment Stairs: Yes: External: 6 steps; can reach both Has following equipment at home: None  OCCUPATION: not working  PLOF: Independent and Independent with basic ADLs  PATIENT GOALS: get rid of pain   NEXT MD VISIT:   OBJECTIVE:  Note: Objective measures were completed at Evaluation unless otherwise noted.  DIAGNOSTIC FINDINGS:   COGNITION: Overall cognitive status: Within functional limits for tasks assessed     SENSATION: WFL  EDEMA:  Swelling in bilateral lower extremities   MUSCLE LENGTH: Hamstrings: tightness in bilateral LE  POSTURE: rounded shoulders  PALPATION: Some pitting edema in lower legs   LOWER EXTREMITY ROM: Overall WFL, swelling in ankles limites DF/PF some   LOWER EXTREMITY MMT: 5/5 bilaterally   FUNCTIONAL TESTS:  5 times sit to stand: 17s from chair height  Timed up and go (TUG): 19.64s   GAIT: Distance walked: in clinic distances Assistive device utilized: None Level of assistance: Complete Independence Comments: slowed cadence and gait, antalgic                                                                                                                                TREATMENT DATE:   11/17/23 Nustep L 5 Red tband standing hip flex,ext and abd 10 x each -issued for HEP Resisted gait 20# 4 x 4 ways HS curls 20lb  2x10 Leg Ext 5lb 2x10 S2S 20x Black bar DF and PF 2 sets 10 4in step ups 10x each Feet on ball bridge,KTC and obl 2 sets 10    11/15/23 NuStep L 5 x 6 min Hs curls 20lb 2x10 Leg Ext 5lb 2x10 S2S 2x10 Ankle PF/ DF green x15 each Hip add ball squeeze Hip abd w/ Manual resistance 4in step ups 2x5 each  10/26/23 EVAL    PATIENT EDUCATION:  Education details: POC and HEP, importance of elevating and wearing compression stockings Person educated:  Patient and Child(ren) Education method: Explanation and Demonstration Education comprehension: verbalized understanding and returned demonstration  HOME EXERCISE PROGRAM:  Hip SL hip flex,ext and abd red tband  11/17/23 Access Code: [email or web address] URL: [email or web address] Date: 10/26/2023 Prepared by: Almetta Fam  Exercises - Supine Ankle Pumps  - 1 x daily - 7 x weekly - 3 sets - 10 reps - Ankle Pumps in Elevation  - 1 x daily - 7 x weekly - 3 sets - 10 reps - Supine Active Straight Leg Raise  - 1 x daily - 7 x weekly - 2 sets - 10 reps - Supine Lower Trunk Rotation  - 1 x daily - 7 x weekly - 2 sets - 10 reps - Sit to Stand  - 1 x daily - 7 x weekly - 2 sets - 10 reps  Elevating for 30 mins at a time a few times a day  ASSESSMENT:  CLINICAL IMPRESSION: pt arrived with minimal soreness after last session. As we progressed ex she needed cuing to slow and control her speed. Added to HEP. STG 1 met Stressed need to elevate above heart 30 min several times per day to help with swelling.  OBJECTIVE IMPAIRMENTS: Abnormal gait, decreased activity tolerance, decreased balance, decreased endurance, difficulty walking, increased edema, improper body mechanics, postural dysfunction, and pain.   ACTIVITY LIMITATIONS: lifting, sitting, standing, squatting, stairs, transfers, and locomotion level  PARTICIPATION LIMITATIONS: cleaning, laundry, shopping, community activity, and yard work  PERSONAL FACTORS: Age and Past/current  experiences are also affecting patient's functional outcome.   REHAB POTENTIAL: Good  CLINICAL DECISION MAKING: Evolving/moderate complexity  EVALUATION COMPLEXITY: Low   GOALS: Goals reviewed with patient? Yes  SHORT TERM GOALS: Target date: 11/30/23  Patient will be independent with initial HEP. Baseline:  Goal status: Progressing 11/15/23  MET 11/17/23  2.  Patient will demonstrate 5xSTS <15s from chair height without pain in legs  Baseline: 17s burning in legs Goal status: INITIAL   LONG TERM GOALS: Target date: 01/04/24  Patient will be independent with advanced/ongoing HEP to improve outcomes and carryover.  Baseline:  Goal status: INITIAL  2.  Patient will report 50-75% improvement with lower extremity pain Baseline: 10/10 at worst Goal status: INITIAL  3. Patient will demonstrate decreased fall risk by scoring < 14 sec on TUG. Baseline: 19.64s Goal status: INITIAL  4.  Patient will have 50% decrease in lower extremity edema  Baseline: bilateral swelling Goal status: INITIAL  PLAN:  PT FREQUENCY: 1-2x/week  PT DURATION: 10 weeks  PLANNED INTERVENTIONS: 97110-Therapeutic exercises, 97530- Therapeutic activity, 97112- Neuromuscular re-education, 97535- Self Care, [id number]- Manual therapy, [phone]- Gait training, [phone]- Vasopneumatic device, Patient/Family education, Balance training, Stair training, Taping, Joint mobilization, Spinal mobilization, Manual lymph drainage, Compression bandaging, Cryotherapy, and Moist heat  PLAN FOR NEXT SESSION: progress strength,func and work to decrease edema Reha Martinovich,ANGIE, PTA 11/17/2023, 4:59 PM

## 2023-11-21 ENCOUNTER — Ambulatory Visit: Attending: Family Medicine

## 2023-11-21 DIAGNOSIS — M79661 Pain in right lower leg: Secondary | ICD-10-CM | POA: Insufficient documentation

## 2023-11-21 DIAGNOSIS — R600 Localized edema: Secondary | ICD-10-CM | POA: Diagnosis not present

## 2023-11-21 DIAGNOSIS — M79662 Pain in left lower leg: Secondary | ICD-10-CM | POA: Insufficient documentation

## 2023-11-21 NOTE — Therapy (Signed)
 OUTPATIENT PHYSICAL THERAPY LOWER EXTREMITY    Patient Name: [name] MRN: [id number] DOB:[birth year], 68 y.o., female Today's Date: 11/21/2023  END OF SESSION:  PT End of Session - 11/21/23 1654     Visit Number 4    Date for PT Re-Evaluation 01/04/24    Authorization Type [employer]et[employer]    PT Start Time 1655    PT Stop Time 1740    PT Time Calculation (min) 45 min           Past Medical History:  Diagnosis Date   Anxiety    Past Surgical History:  Procedure Laterality Date   ABDOMINAL HYSTERECTOMY     GAS/FLUID EXCHANGE Right 01/05/2016   Procedure: GAS/FLUID EXCHANGE;  Surgeon: Selinda Slocumb, MD;  Location: [facility] OR;  Service: Ophthalmology;  Laterality: Right;   INJECTION OF SILICONE OIL Right 02/19/2016   Procedure: INJECTION OF SILICONE OIL IN RIGHT EYE;  Surgeon: Selinda Slocumb, MD;  Location: [facility] OR;  Service: Ophthalmology;  Laterality: Right;   LASER PHOTO ABLATION Right 01/05/2016   Procedure: LASER PHOTO ABLATION;  Surgeon: Selinda Slocumb, MD;  Location: [facility] OR;  Service: Ophthalmology;  Laterality: Right;   LASER PHOTO ABLATION Right 02/19/2016   Procedure: LASER PHOTO ABLATION OF RIGHT EYE;  Surgeon: Selinda Slocumb, MD;  Location: [facility] OR;  Service: Ophthalmology;  Laterality: Right;   PARS PLANA VITRECTOMY Right 01/05/2016   Procedure: PARS PLANA VITRECTOMY WITH 25 GAUGE;  Surgeon: Selinda Slocumb, MD;  Location: [facility] OR;  Service: Ophthalmology;  Laterality: Right;   PARS PLANA VITRECTOMY Right 05/31/2016   Procedure: PARS PLANA VITRECTOMY WITH 25 GAUGE;  Surgeon: Selinda Slocumb, MD;  Location: [facility] OR;  Service: Ophthalmology;  Laterality: Right;  Vitrectomy , AC washout, removal of silicone oil right eye   SCLERAL BUCKLE Right 12/18/2015   Procedure: SCLERAL BUCKLE with CRYO and GAS BUBBLE to RIGHT EYE;  Surgeon: Selinda Slocumb, MD;  Location: [facility] OR;  Service: Ophthalmology;  Laterality: Right;   TONSILLECTOMY     Patient Active Problem List   Diagnosis Date Noted   Sensory  hearing loss, bilateral 04/16/2023   Nonobliterative otosclerosis involving oval window, right 04/16/2023   Hyperlipidemia 11/01/2022   Chronic back pain 09/27/2022   Bilateral edema of lower extremity 08/23/2022   Esophageal dysphagia 08/23/2022   HOH (hard of hearing) 08/23/2022    PCP: Damien Pinal  REFERRING PROVIDER: Krystal McDiarmid  REFERRING DIAG:  R60.0 (ICD-10-CM) - Bilateral edema of lower extremity    THERAPY DIAG:  Pain in both lower legs  Localized edema  Rationale for Evaluation and Treatment: Rehabilitation  ONSET DATE: 09/23/23  SUBJECTIVE:   SUBJECTIVE STATEMENT: I have a migraine. My legs are okay but still swollen. Couple of times I go home after therapy and they swell up.   PERTINENT HISTORY: This has been an ongoing issue for over a year now.  She has previously been evaluated by Dr. Pinal and was started on Lasix , initially at 20 mg daily, now requiring 40mg  Lasix  daily.  Dr. Pinal ordered an echocardiogram which demonstrated elevated pulmonary artery pressures.  Was subsequently seen by pulm. Per Dr. Correne note: We discussed that the ultimate test for pulmonary hypertension is a right heart catheterization  She has no family or personal history of autoimmune disease. She has no dyspnea. No personal or family history of VTE. The likelihood that this is group 1 PAH requiring anything beyond diuretics is fairly low given age, exam, symptoms, absence of co-morbid conditions.  The  presence of lower extremity edema suggests that this is not isolated right sided heart failure. No recent BNP.  She reports that the swelling that she has in both of her legs is so bad that it affects her ability to stay active, though she knows that exercise is an important part of treating lower extremity edema.  This is frustrating for her.  She does endorse good adherence to her compression stockings and is not in fact wearing them today. She does have a history of abdominal  surgeries with hysterectomy, though this was several years before the onset of present symptoms.  She has no known history of liver kidney disease. She is not having any shortness of breath associated.  PAIN:  Are you having pain? Yes: NPRS scale: 0/10 Pain location: both legs below knees, into ankles and feet Pain description: cramping, burning, stabbing  Aggravating factors: standing, sitting, walking Relieving factors: nothing yet  PRECAUTIONS: None  RED FLAGS: None   WEIGHT BEARING RESTRICTIONS: No  FALLS:  Has patient fallen in last 6 months? No  LIVING ENVIRONMENT: Lives with: lives with their daughter Lives in: House/apartment Stairs: Yes: External: 6 steps; can reach both Has following equipment at home: None  OCCUPATION: not working  PLOF: Independent and Independent with basic ADLs  PATIENT GOALS: get rid of pain   NEXT MD VISIT:   OBJECTIVE:  Note: Objective measures were completed at Evaluation unless otherwise noted.  DIAGNOSTIC FINDINGS:   COGNITION: Overall cognitive status: Within functional limits for tasks assessed     SENSATION: WFL  EDEMA:  Swelling in bilateral lower extremities   MUSCLE LENGTH: Hamstrings: tightness in bilateral LE  POSTURE: rounded shoulders  PALPATION: Some pitting edema in lower legs   LOWER EXTREMITY ROM: Overall WFL, swelling in ankles limites DF/PF some   LOWER EXTREMITY MMT: 5/5 bilaterally   FUNCTIONAL TESTS:  5 times sit to stand: 17s from chair height  Timed up and go (TUG): 19.64s   GAIT: Distance walked: in clinic distances Assistive device utilized: None Level of assistance: Complete Independence Comments: slowed cadence and gait, antalgic                                                                                                                                TREATMENT DATE:  11/21/23 NuStep L5x12mins  HS curls 20# 2x10 Leg ext 5# 2x10 Leg press 20# 2x10 Step ups 6 forward and lateral   Supine SLR 2x10 Feet on pball rotations and knees to chest  Passive HS stretching    11/17/23 Nustep L 5 Red tband standing hip flex,ext and abd 10 x each -issued for HEP Resisted gait 20# 4 x 4 ways HS curls 20lb 2x10 Leg Ext 5lb 2x10 S2S 20x Black bar DF and PF 2 sets 10 4in step ups 10x each Feet on ball bridge,KTC and obl 2 sets 10    11/15/23 NuStep L 5 x 6 min Hs  curls 20lb 2x10 Leg Ext 5lb 2x10 S2S 2x10 Ankle PF/ DF green x15 each Hip add ball squeeze Hip abd w/ Manual resistance 4in step ups 2x5 each  10/26/23 EVAL    PATIENT EDUCATION:  Education details: POC and HEP, importance of elevating and wearing compression stockings Person educated: Patient and Child(ren) Education method: Explanation and Demonstration Education comprehension: verbalized understanding and returned demonstration  HOME EXERCISE PROGRAM:  Hip SL hip flex,ext and abd red tband  11/17/23 Access Code: [email or web address] URL: [email or web address] Date: 10/26/2023 Prepared by: Almetta Fam  Exercises - Supine Ankle Pumps  - 1 x daily - 7 x weekly - 3 sets - 10 reps - Ankle Pumps in Elevation  - 1 x daily - 7 x weekly - 3 sets - 10 reps - Supine Active Straight Leg Raise  - 1 x daily - 7 x weekly - 2 sets - 10 reps - Supine Lower Trunk Rotation  - 1 x daily - 7 x weekly - 2 sets - 10 reps - Sit to Stand  - 1 x daily - 7 x weekly - 2 sets - 10 reps  Elevating for 30 mins at a time a few times a day  ASSESSMENT:  CLINICAL IMPRESSION: pt arrived with some ongoing swelling. She reports the swelling usually gets worse when she leaves PT and was advised that with activity this may happen which is why elevating above the heart 30 min several times per day will be beneficial. Does well with all interventions today, no c/o increase in pain.  OBJECTIVE IMPAIRMENTS: Abnormal gait, decreased activity tolerance, decreased balance, decreased endurance, difficulty walking, increased edema,  improper body mechanics, postural dysfunction, and pain.   ACTIVITY LIMITATIONS: lifting, sitting, standing, squatting, stairs, transfers, and locomotion level  PARTICIPATION LIMITATIONS: cleaning, laundry, shopping, community activity, and yard work  PERSONAL FACTORS: Age and Past/current experiences are also affecting patient's functional outcome.   REHAB POTENTIAL: Good  CLINICAL DECISION MAKING: Evolving/moderate complexity  EVALUATION COMPLEXITY: Low   GOALS: Goals reviewed with patient? Yes  SHORT TERM GOALS: Target date: 11/30/23  Patient will be independent with initial HEP. Baseline:  Goal status: Progressing 11/15/23  MET 11/17/23  2.  Patient will demonstrate 5xSTS <15s from chair height without pain in legs  Baseline: 17s burning in legs Goal status: INITIAL   LONG TERM GOALS: Target date: 01/04/24  Patient will be independent with advanced/ongoing HEP to improve outcomes and carryover.  Baseline:  Goal status: INITIAL  2.  Patient will report 50-75% improvement with lower extremity pain Baseline: 10/10 at worst Goal status: INITIAL  3. Patient will demonstrate decreased fall risk by scoring < 14 sec on TUG. Baseline: 19.64s Goal status: INITIAL  4.  Patient will have 50% decrease in lower extremity edema  Baseline: bilateral swelling Goal status: INITIAL  PLAN:  PT FREQUENCY: 1-2x/week  PT DURATION: 10 weeks  PLANNED INTERVENTIONS: 97110-Therapeutic exercises, 97530- Therapeutic activity, 97112- Neuromuscular re-education, 97535- Self Care, [id number]- Manual therapy, [phone]- Gait training, [phone]- Vasopneumatic device, Patient/Family education, Balance training, Stair training, Taping, Joint mobilization, Spinal mobilization, Manual lymph drainage, Compression bandaging, Cryotherapy, and Moist heat  PLAN FOR NEXT SESSION: progress strength,func and work to decrease edema  Almetta Fam, PT 11/21/2023, 5:38 PM

## 2023-11-23 ENCOUNTER — Ambulatory Visit

## 2023-11-23 NOTE — Therapy (Incomplete)
 OUTPATIENT PHYSICAL THERAPY LOWER EXTREMITY    Patient Name: [name] MRN: [id number] DOB:Mar 16, 1956, 68 y.o., female Today's Date: 11/23/2023  END OF SESSION:     Past Medical History:  Diagnosis Date   Anxiety    Past Surgical History:  Procedure Laterality Date   ABDOMINAL HYSTERECTOMY     GAS/FLUID EXCHANGE Right 01/05/2016   Procedure: GAS/FLUID EXCHANGE;  Surgeon: Selinda Slocumb, MD;  Location: [facility] OR;  Service: Ophthalmology;  Laterality: Right;   INJECTION OF SILICONE OIL Right 02/19/2016   Procedure: INJECTION OF SILICONE OIL IN RIGHT EYE;  Surgeon: Selinda Slocumb, MD;  Location: [facility] OR;  Service: Ophthalmology;  Laterality: Right;   LASER PHOTO ABLATION Right 01/05/2016   Procedure: LASER PHOTO ABLATION;  Surgeon: Selinda Slocumb, MD;  Location: [facility] OR;  Service: Ophthalmology;  Laterality: Right;   LASER PHOTO ABLATION Right 02/19/2016   Procedure: LASER PHOTO ABLATION OF RIGHT EYE;  Surgeon: Selinda Slocumb, MD;  Location: [facility] OR;  Service: Ophthalmology;  Laterality: Right;   PARS PLANA VITRECTOMY Right 01/05/2016   Procedure: PARS PLANA VITRECTOMY WITH 25 GAUGE;  Surgeon: Selinda Slocumb, MD;  Location: [facility] OR;  Service: Ophthalmology;  Laterality: Right;   PARS PLANA VITRECTOMY Right 05/31/2016   Procedure: PARS PLANA VITRECTOMY WITH 25 GAUGE;  Surgeon: Selinda Slocumb, MD;  Location: [facility] OR;  Service: Ophthalmology;  Laterality: Right;  Vitrectomy , AC washout, removal of silicone oil right eye   SCLERAL BUCKLE Right 12/18/2015   Procedure: SCLERAL BUCKLE with CRYO and GAS BUBBLE to RIGHT EYE;  Surgeon: Selinda Slocumb, MD;  Location: [facility] OR;  Service: Ophthalmology;  Laterality: Right;   TONSILLECTOMY     Patient Active Problem List   Diagnosis Date Noted   Sensory hearing loss, bilateral 04/16/2023   Nonobliterative otosclerosis involving oval window, right 04/16/2023   Hyperlipidemia 11/01/2022   Chronic back pain 09/27/2022   Bilateral edema of lower extremity 08/23/2022    Esophageal dysphagia 08/23/2022   HOH (hard of hearing) 08/23/2022    PCP: Damien Pinal  REFERRING PROVIDER: Krystal McDiarmid  REFERRING DIAG:  R60.0 (ICD-10-CM) - Bilateral edema of lower extremity    THERAPY DIAG:  No diagnosis found.  Rationale for Evaluation and Treatment: Rehabilitation  ONSET DATE: 09/23/23  SUBJECTIVE:   SUBJECTIVE STATEMENT: I have a migraine. My legs are okay but still swollen. Couple of times I go home after therapy and they swell up.   PERTINENT HISTORY: This has been an ongoing issue for over a year now.  She has previously been evaluated by Dr. Pinal and was started on Lasix , initially at 20 mg daily, now requiring 40mg  Lasix  daily.  Dr. Pinal ordered an echocardiogram which demonstrated elevated pulmonary artery pressures.  Was subsequently seen by pulm. Per Dr. Correne note: We discussed that the ultimate test for pulmonary hypertension is a right heart catheterization  She has no family or personal history of autoimmune disease. She has no dyspnea. No personal or family history of VTE. The likelihood that this is group 1 PAH requiring anything beyond diuretics is fairly low given age, exam, symptoms, absence of co-morbid conditions.  The presence of lower extremity edema suggests that this is not isolated right sided heart failure. No recent BNP.  She reports that the swelling that she has in both of her legs is so bad that it affects her ability to stay active, though she knows that exercise is an important part of treating lower extremity edema.  This is frustrating for her.  She does endorse good adherence to her compression stockings and is not in fact wearing them today. She does have a history of abdominal surgeries with hysterectomy, though this was several years before the onset of present symptoms.  She has no known history of liver kidney disease. She is not having any shortness of breath associated.  PAIN:  Are you having pain? Yes: NPRS  scale: 0/10 Pain location: both legs below knees, into ankles and feet Pain description: cramping, burning, stabbing  Aggravating factors: standing, sitting, walking Relieving factors: nothing yet  PRECAUTIONS: None  RED FLAGS: None   WEIGHT BEARING RESTRICTIONS: No  FALLS:  Has patient fallen in last 6 months? No  LIVING ENVIRONMENT: Lives with: lives with their daughter Lives in: House/apartment Stairs: Yes: External: 6 steps; can reach both Has following equipment at home: None  OCCUPATION: not working  PLOF: Independent and Independent with basic ADLs  PATIENT GOALS: get rid of pain   NEXT MD VISIT:   OBJECTIVE:  Note: Objective measures were completed at Evaluation unless otherwise noted.  DIAGNOSTIC FINDINGS:   COGNITION: Overall cognitive status: Within functional limits for tasks assessed     SENSATION: WFL  EDEMA:  Swelling in bilateral lower extremities   MUSCLE LENGTH: Hamstrings: tightness in bilateral LE  POSTURE: rounded shoulders  PALPATION: Some pitting edema in lower legs   LOWER EXTREMITY ROM: Overall WFL, swelling in ankles limites DF/PF some   LOWER EXTREMITY MMT: 5/5 bilaterally   FUNCTIONAL TESTS:  5 times sit to stand: 17s from chair height  Timed up and go (TUG): 19.64s   GAIT: Distance walked: in clinic distances Assistive device utilized: None Level of assistance: Complete Independence Comments: slowed cadence and gait, antalgic                                                                                                                                TREATMENT DATE:  11/23/23 NuStep Resisted gait  Walking on beam Step ups on airex Passive LE stretching- HS, glutes, piriformis, LTR Bridges  STS with OHP  11/21/23 NuStep L5x32mins  HS curls 20# 2x10 Leg ext 5# 2x10 Leg press 20# 2x10 Step ups 6 forward and lateral  Supine SLR 2x10 Feet on pball rotations and knees to chest  Passive HS stretching     11/17/23 Nustep L 5 Red tband standing hip flex,ext and abd 10 x each -issued for HEP Resisted gait 20# 4 x 4 ways HS curls 20lb 2x10 Leg Ext 5lb 2x10 S2S 20x Black bar DF and PF 2 sets 10 4in step ups 10x each Feet on ball bridge,KTC and obl 2 sets 10    11/15/23 NuStep L 5 x 6 min Hs curls 20lb 2x10 Leg Ext 5lb 2x10 S2S 2x10 Ankle PF/ DF green x15 each Hip add ball squeeze Hip abd w/ Manual resistance 4in step ups 2x5 each  10/26/23 EVAL    PATIENT EDUCATION:  Education  details: POC and HEP, importance of elevating and wearing compression stockings Person educated: Patient and Child(ren) Education method: Explanation and Demonstration Education comprehension: verbalized understanding and returned demonstration  HOME EXERCISE PROGRAM:  Hip SL hip flex,ext and abd red tband  11/17/23 Access Code: [email or web address] URL: [email or web address] Date: 10/26/2023 Prepared by: Almetta Fam  Exercises - Supine Ankle Pumps  - 1 x daily - 7 x weekly - 3 sets - 10 reps - Ankle Pumps in Elevation  - 1 x daily - 7 x weekly - 3 sets - 10 reps - Supine Active Straight Leg Raise  - 1 x daily - 7 x weekly - 2 sets - 10 reps - Supine Lower Trunk Rotation  - 1 x daily - 7 x weekly - 2 sets - 10 reps - Sit to Stand  - 1 x daily - 7 x weekly - 2 sets - 10 reps  Elevating for 30 mins at a time a few times a day  ASSESSMENT:  CLINICAL IMPRESSION: pt arrived with some ongoing swelling. She reports the swelling usually gets worse when she leaves PT and was advised that with activity this may happen which is why elevating above the heart 30 min several times per day will be beneficial. Does well with all interventions today, no c/o increase in pain.  OBJECTIVE IMPAIRMENTS: Abnormal gait, decreased activity tolerance, decreased balance, decreased endurance, difficulty walking, increased edema, improper body mechanics, postural dysfunction, and pain.   ACTIVITY LIMITATIONS:  lifting, sitting, standing, squatting, stairs, transfers, and locomotion level  PARTICIPATION LIMITATIONS: cleaning, laundry, shopping, community activity, and yard work  PERSONAL FACTORS: Age and Past/current experiences are also affecting patient's functional outcome.   REHAB POTENTIAL: Good  CLINICAL DECISION MAKING: Evolving/moderate complexity  EVALUATION COMPLEXITY: Low   GOALS: Goals reviewed with patient? Yes  SHORT TERM GOALS: Target date: 11/30/23  Patient will be independent with initial HEP. Baseline:  Goal status: Progressing 11/15/23  MET 11/17/23  2.  Patient will demonstrate 5xSTS <15s from chair height without pain in legs  Baseline: 17s burning in legs Goal status: INITIAL   LONG TERM GOALS: Target date: 01/04/24  Patient will be independent with advanced/ongoing HEP to improve outcomes and carryover.  Baseline:  Goal status: INITIAL  2.  Patient will report 50-75% improvement with lower extremity pain Baseline: 10/10 at worst Goal status: INITIAL  3. Patient will demonstrate decreased fall risk by scoring < 14 sec on TUG. Baseline: 19.64s Goal status: INITIAL  4.  Patient will have 50% decrease in lower extremity edema  Baseline: bilateral swelling Goal status: INITIAL  PLAN:  PT FREQUENCY: 1-2x/week  PT DURATION: 10 weeks  PLANNED INTERVENTIONS: 97110-Therapeutic exercises, 97530- Therapeutic activity, 97112- Neuromuscular re-education, 97535- Self Care, 02859- Manual therapy, [phone]- Gait training, [phone]- Vasopneumatic device, Patient/Family education, Balance training, Stair training, Taping, Joint mobilization, Spinal mobilization, Manual lymph drainage, Compression bandaging, Cryotherapy, and Moist heat  PLAN FOR NEXT SESSION: progress strength,func and work to decrease edema  Almetta Fam, PT 11/23/2023, 7:59 AM

## 2023-11-25 NOTE — Therapy (Signed)
 OUTPATIENT PHYSICAL THERAPY LOWER EXTREMITY    Patient Name: [name] MRN: [id number] DOB:1955/07/21, 68 y.o., female Today's Date: 11/28/2023  END OF SESSION:  PT End of Session - 11/28/23 1653     Visit Number 5    Date for PT Re-Evaluation 01/04/24    Authorization Type [employer]    PT Start Time 1653    PT Stop Time 1740    PT Time Calculation (min) 47 min            Past Medical History:  Diagnosis Date   Anxiety    Past Surgical History:  Procedure Laterality Date   ABDOMINAL HYSTERECTOMY     GAS/FLUID EXCHANGE Right 01/05/2016   Procedure: GAS/FLUID EXCHANGE;  Surgeon: Selinda Slocumb, MD;  Location: [facility] OR;  Service: Ophthalmology;  Laterality: Right;   INJECTION OF SILICONE OIL Right 02/19/2016   Procedure: INJECTION OF SILICONE OIL IN RIGHT EYE;  Surgeon: Selinda Slocumb, MD;  Location: [facility] OR;  Service: Ophthalmology;  Laterality: Right;   LASER PHOTO ABLATION Right 01/05/2016   Procedure: LASER PHOTO ABLATION;  Surgeon: Selinda Slocumb, MD;  Location: [facility] OR;  Service: Ophthalmology;  Laterality: Right;   LASER PHOTO ABLATION Right 02/19/2016   Procedure: LASER PHOTO ABLATION OF RIGHT EYE;  Surgeon: Selinda Slocumb, MD;  Location: [facility] OR;  Service: Ophthalmology;  Laterality: Right;   PARS PLANA VITRECTOMY Right 01/05/2016   Procedure: PARS PLANA VITRECTOMY WITH 25 GAUGE;  Surgeon: Selinda Slocumb, MD;  Location: [facility] OR;  Service: Ophthalmology;  Laterality: Right;   PARS PLANA VITRECTOMY Right 05/31/2016   Procedure: PARS PLANA VITRECTOMY WITH 25 GAUGE;  Surgeon: Selinda Slocumb, MD;  Location: [facility] OR;  Service: Ophthalmology;  Laterality: Right;  Vitrectomy , AC washout, removal of silicone oil right eye   SCLERAL BUCKLE Right 12/18/2015   Procedure: SCLERAL BUCKLE with CRYO and GAS BUBBLE to RIGHT EYE;  Surgeon: Selinda Slocumb, MD;  Location: [facility] OR;  Service: Ophthalmology;  Laterality: Right;   TONSILLECTOMY     Patient Active Problem List   Diagnosis Date Noted   Sensory  hearing loss, bilateral 04/16/2023   Nonobliterative otosclerosis involving oval window, right 04/16/2023   Hyperlipidemia 11/01/2022   Chronic back pain 09/27/2022   Bilateral edema of lower extremity 08/23/2022   Esophageal dysphagia 08/23/2022   HOH (hard of hearing) 08/23/2022    PCP: Damien Pinal  REFERRING PROVIDER: Krystal McDiarmid  REFERRING DIAG:  R60.0 (ICD-10-CM) - Bilateral edema of lower extremity    THERAPY DIAG:  Pain in both lower legs  Localized edema  Rationale for Evaluation and Treatment: Rehabilitation  ONSET DATE: 09/23/23  SUBJECTIVE:   SUBJECTIVE STATEMENT: I am here today. But I won't be Wednesday or after that until I talk to the doctor. The swelling is still going on and have pain in my legs and feet.   PERTINENT HISTORY: This has been an ongoing issue for over a year now.  She has previously been evaluated by Dr. Pinal and was started on Lasix , initially at 20 mg daily, now requiring 40mg  Lasix  daily.  Dr. Pinal ordered an echocardiogram which demonstrated elevated pulmonary artery pressures.  Was subsequently seen by pulm. Per Dr. Correne note: We discussed that the ultimate test for pulmonary hypertension is a right heart catheterization  She has no family or personal history of autoimmune disease. She has no dyspnea. No personal or family history of VTE. The likelihood that this is group 1 PAH requiring anything beyond diuretics is fairly low  given age, exam, symptoms, absence of co-morbid conditions.  The presence of lower extremity edema suggests that this is not isolated right sided heart failure. No recent BNP.  She reports that the swelling that she has in both of her legs is so bad that it affects her ability to stay active, though she knows that exercise is an important part of treating lower extremity edema.  This is frustrating for her.  She does endorse good adherence to her compression stockings and is not in fact wearing them today. She  does have a history of abdominal surgeries with hysterectomy, though this was several years before the onset of present symptoms.  She has no known history of liver kidney disease. She is not having any shortness of breath associated.  PAIN:  Are you having pain? Yes: NPRS scale: 0/10 Pain location: both legs below knees, into ankles and feet Pain description: cramping, burning, stabbing  Aggravating factors: standing, sitting, walking Relieving factors: nothing yet  PRECAUTIONS: None  RED FLAGS: None   WEIGHT BEARING RESTRICTIONS: No  FALLS:  Has patient fallen in last 6 months? No  LIVING ENVIRONMENT: Lives with: lives with their daughter Lives in: House/apartment Stairs: Yes: External: 6 steps; can reach both Has following equipment at home: None  OCCUPATION: not working  PLOF: Independent and Independent with basic ADLs  PATIENT GOALS: get rid of pain   NEXT MD VISIT:   OBJECTIVE:  Note: Objective measures were completed at Evaluation unless otherwise noted.  DIAGNOSTIC FINDINGS:   COGNITION: Overall cognitive status: Within functional limits for tasks assessed     SENSATION: WFL  EDEMA:  Swelling in bilateral lower extremities   MUSCLE LENGTH: Hamstrings: tightness in bilateral LE  POSTURE: rounded shoulders  PALPATION: Some pitting edema in lower legs   LOWER EXTREMITY ROM: Overall WFL, swelling in ankles limites DF/PF some   LOWER EXTREMITY MMT: 5/5 bilaterally   FUNCTIONAL TESTS:  5 times sit to stand: 17s from chair height  Timed up and go (TUG): 19.64s   GAIT: Distance walked: in clinic distances Assistive device utilized: None Level of assistance: Complete Independence Comments: slowed cadence and gait, antalgic                                                                                                                                TREATMENT DATE:  11/28/23 NuStep L5x30mins  Resisted gait 20# 4 way x4 Calf stretch on  slant Walking on beam Passive LE stretching- HS, glutes, piriformis, LTR Bridges 2x10 STS with OHP 2x10 Fitter pushes 2x10    11/21/23 NuStep L5x51mins  HS curls 20# 2x10 Leg ext 5# 2x10 Leg press 20# 2x10 Step ups 6 forward and lateral  Supine SLR 2x10 Feet on pball rotations and knees to chest  Passive HS stretching    11/17/23 Nustep L 5 Red tband standing hip flex,ext and abd 10 x each -issued for HEP Resisted gait 20# 4  x 4 ways HS curls 20lb 2x10 Leg Ext 5lb 2x10 S2S 20x Black bar DF and PF 2 sets 10 4in step ups 10x each Feet on ball bridge,KTC and obl 2 sets 10    11/15/23 NuStep L 5 x 6 min Hs curls 20lb 2x10 Leg Ext 5lb 2x10 S2S 2x10 Ankle PF/ DF green x15 each Hip add ball squeeze Hip abd w/ Manual resistance 4in step ups 2x5 each  10/26/23 EVAL    PATIENT EDUCATION:  Education details: POC and HEP, importance of elevating and wearing compression stockings Person educated: Patient and Child(ren) Education method: Explanation and Demonstration Education comprehension: verbalized understanding and returned demonstration  HOME EXERCISE PROGRAM:  Hip SL hip flex,ext and abd red tband  11/17/23 Access Code: [email or web address] URL: [email or web address] Date: 10/26/2023 Prepared by: Almetta Fam  Exercises - Supine Ankle Pumps  - 1 x daily - 7 x weekly - 3 sets - 10 reps - Ankle Pumps in Elevation  - 1 x daily - 7 x weekly - 3 sets - 10 reps - Supine Active Straight Leg Raise  - 1 x daily - 7 x weekly - 2 sets - 10 reps - Supine Lower Trunk Rotation  - 1 x daily - 7 x weekly - 2 sets - 10 reps - Sit to Stand  - 1 x daily - 7 x weekly - 2 sets - 10 reps  Elevating for 30 mins at a time a few times a day  ASSESSMENT:  CLINICAL IMPRESSION: pt arrived with some ongoing swelling. She reports wanting to hold off scheduling PT for now until she can go back to the doctor and see what is going on. Requested not to do the leg press, as it made her sore  last time. Does well with all interventions today, no c/o increase in pain.   OBJECTIVE IMPAIRMENTS: Abnormal gait, decreased activity tolerance, decreased balance, decreased endurance, difficulty walking, increased edema, improper body mechanics, postural dysfunction, and pain.   ACTIVITY LIMITATIONS: lifting, sitting, standing, squatting, stairs, transfers, and locomotion level  PARTICIPATION LIMITATIONS: cleaning, laundry, shopping, community activity, and yard work  PERSONAL FACTORS: Age and Past/current experiences are also affecting patient's functional outcome.   REHAB POTENTIAL: Good  CLINICAL DECISION MAKING: Evolving/moderate complexity  EVALUATION COMPLEXITY: Low   GOALS: Goals reviewed with patient? Yes  SHORT TERM GOALS: Target date: 11/30/23  Patient will be independent with initial HEP. Baseline:  Goal status: Progressing 11/15/23  MET 11/17/23  2.  Patient will demonstrate 5xSTS <15s from chair height without pain in legs  Baseline: 17s burning in legs Goal status: MET 14s   LONG TERM GOALS: Target date: 01/04/24  Patient will be independent with advanced/ongoing HEP to improve outcomes and carryover.  Baseline:  Goal status: INITIAL  2.  Patient will report 50-75% improvement with lower extremity pain Baseline: 10/10 at worst Goal status: IN PROGRESS 11/28/23  3. Patient will demonstrate decreased fall risk by scoring < 14 sec on TUG. Baseline: 19.64s Goal status: INITIAL  4.  Patient will have 50% decrease in lower extremity edema  Baseline: bilateral swelling Goal status: IN PROGRESS 11/28/23  PLAN:  PT FREQUENCY: 1-2x/week  PT DURATION: 10 weeks  PLANNED INTERVENTIONS: 97110-Therapeutic exercises, 97530- Therapeutic activity, 97112- Neuromuscular re-education, 97535- Self Care, 02859- Manual therapy, [phone]- Gait training, [phone]- Vasopneumatic device, Patient/Family education, Balance training, Stair training, Taping, Joint mobilization, Spinal  mobilization, Manual lymph drainage, Compression bandaging, Cryotherapy, and Moist heat  PLAN FOR NEXT SESSION: progress strength,func and work  to decrease edema  Almetta Fam, PT 11/28/2023, 5:34 PM

## 2023-11-28 ENCOUNTER — Ambulatory Visit

## 2023-11-28 DIAGNOSIS — R600 Localized edema: Secondary | ICD-10-CM

## 2023-11-28 DIAGNOSIS — M79661 Pain in right lower leg: Secondary | ICD-10-CM

## 2023-11-28 DIAGNOSIS — M79662 Pain in left lower leg: Secondary | ICD-10-CM | POA: Diagnosis not present

## 2023-11-30 ENCOUNTER — Ambulatory Visit

## 2023-12-19 ENCOUNTER — Other Ambulatory Visit: Payer: Self-pay | Admitting: Student

## 2023-12-19 DIAGNOSIS — R1319 Other dysphagia: Secondary | ICD-10-CM

## 2024-03-03 ENCOUNTER — Other Ambulatory Visit: Payer: Self-pay | Admitting: Student

## 2024-03-03 DIAGNOSIS — R600 Localized edema: Secondary | ICD-10-CM

## 2024-03-19 ENCOUNTER — Encounter

## 2024-03-20 ENCOUNTER — Telehealth (INDEPENDENT_AMBULATORY_CARE_PROVIDER_SITE_OTHER): Payer: Self-pay | Admitting: Otolaryngology

## 2024-03-20 NOTE — Telephone Encounter (Signed)
 LVM for patient to call back to reschedule 04/13/2024 appoiontment.  Provider not in office.

## 2024-03-22 ENCOUNTER — Other Ambulatory Visit: Payer: Self-pay | Admitting: Student

## 2024-03-22 DIAGNOSIS — R1319 Other dysphagia: Secondary | ICD-10-CM

## 2024-03-29 NOTE — Progress Notes (Signed)
 [name]                                          MRN: [id number]   03/29/2024   The [facility] Quality Team Specialist reviewed this patient medical record for the purposes of chart review for care gap closure. The following were reviewed: chart review for care gap closure-breast cancer screening.    [facility] Quality Team

## 2024-04-02 ENCOUNTER — Encounter (INDEPENDENT_AMBULATORY_CARE_PROVIDER_SITE_OTHER): Payer: Self-pay

## 2024-04-02 ENCOUNTER — Telehealth (INDEPENDENT_AMBULATORY_CARE_PROVIDER_SITE_OTHER): Payer: Self-pay | Admitting: Otolaryngology

## 2024-04-02 NOTE — Telephone Encounter (Signed)
 Sent MyChart message and left voice message for patient to call to reschedule 04/13/2024 appointment.  Dr Karis will not be in the office.

## 2024-04-13 ENCOUNTER — Ambulatory Visit (INDEPENDENT_AMBULATORY_CARE_PROVIDER_SITE_OTHER): Payer: Medicare HMO | Admitting: Otolaryngology

## 2024-06-04 ENCOUNTER — Encounter
